# Patient Record
Sex: Male | Born: 1985
Health system: Southern US, Community
[De-identification: ages and names within clinical notes are randomized; demographics above are authoritative.]

## PROBLEM LIST (undated history)

## (undated) DIAGNOSIS — K219 Gastro-esophageal reflux disease without esophagitis: Secondary | ICD-10-CM

## (undated) HISTORY — PX: OTHER SURGICAL HISTORY: SHX169

## (undated) HISTORY — PX: KNEE SURGERY: SHX244

---
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
## 2010-03-12 ENCOUNTER — Inpatient Hospital Stay (HOSPITAL_COMMUNITY)
Admission: EM | Admit: 2010-03-12 | Discharge: 2010-03-14 | Payer: Self-pay | Source: Home / Self Care | Admitting: Emergency Medicine

## 2010-08-30 LAB — CBC
HCT: 40.9 % (ref 39.0–52.0)
HCT: 44 % (ref 39.0–52.0)
Hemoglobin: 14.3 g/dL (ref 13.0–17.0)
Hemoglobin: 14.4 g/dL (ref 13.0–17.0)
Hemoglobin: 15.2 g/dL (ref 13.0–17.0)
MCH: 33 pg (ref 26.0–34.0)
MCH: 33.2 pg (ref 26.0–34.0)
MCHC: 34.6 g/dL (ref 30.0–36.0)
MCHC: 34.8 g/dL (ref 30.0–36.0)
MCV: 95.8 fL (ref 78.0–100.0)
MCV: 96 fL (ref 78.0–100.0)
Platelets: 272 10*3/uL (ref 150–400)
Platelets: 278 10*3/uL (ref 150–400)
RBC: 4.32 MIL/uL (ref 4.22–5.81)
RBC: 4.36 MIL/uL (ref 4.22–5.81)
RBC: 4.6 MIL/uL (ref 4.22–5.81)
RDW: 12.4 % (ref 11.5–15.5)
WBC: 13.2 10*3/uL — ABNORMAL HIGH (ref 4.0–10.5)
WBC: 7.4 10*3/uL (ref 4.0–10.5)
WBC: 9.3 10*3/uL (ref 4.0–10.5)

## 2010-08-30 LAB — BASIC METABOLIC PANEL
BUN: 13 mg/dL (ref 6–23)
CO2: 28 mEq/L (ref 19–32)
CO2: 28 mEq/L (ref 19–32)
Calcium: 9.3 mg/dL (ref 8.4–10.5)
Calcium: 9.4 mg/dL (ref 8.4–10.5)
Calcium: 9.5 mg/dL (ref 8.4–10.5)
Chloride: 100 mEq/L (ref 96–112)
Chloride: 109 mEq/L (ref 96–112)
Creatinine, Ser: 1.12 mg/dL (ref 0.4–1.5)
GFR calc Af Amer: >60 mL/min (ref >60)
GFR calc Af Amer: >60 mL/min (ref >60)
GFR calc Af Amer: >60 mL/min (ref >60)
GFR calc non Af Amer: >60 mL/min (ref >60)
GFR calc non Af Amer: >60 mL/min (ref >60)
Glucose, Bld: 92 mg/dL (ref 70–99)
Glucose, Bld: 93 mg/dL (ref 70–99)
Potassium: 4.1 mEq/L (ref 3.5–5.1)
Potassium: 4.5 mEq/L (ref 3.5–5.1)
Sodium: 138 mEq/L (ref 135–145)
Sodium: 140 mEq/L (ref 135–145)
Sodium: 142 mEq/L (ref 135–145)

## 2010-08-30 LAB — HEMOGLOBIN A1C
Hgb A1c MFr Bld: 5.2 % (ref <5.7)
Mean Plasma Glucose: 103 mg/dL (ref <117)

## 2010-08-30 LAB — CULTURE, ROUTINE-ABSCESS

## 2010-08-30 LAB — DIFFERENTIAL
Basophils Absolute: 0 10*3/uL (ref 0.0–0.1)
Basophils Relative: 0 % (ref 0–1)
Basophils Relative: 1 % (ref 0–1)
Eosinophils Absolute: 0.2 10*3/uL (ref 0.0–0.7)
Eosinophils Absolute: 0.3 10*3/uL (ref 0.0–0.7)
Eosinophils Relative: 1 % (ref 0–5)
Lymphocytes Relative: 13 % (ref 12–46)
Lymphocytes Relative: 24 % (ref 12–46)
Lymphocytes Relative: 26 % (ref 12–46)
Lymphs Abs: 1.6 10*3/uL (ref 0.7–4.0)
Lymphs Abs: 1.8 10*3/uL (ref 0.7–4.0)
Lymphs Abs: 2.5 10*3/uL (ref 0.7–4.0)
Monocytes Absolute: 0.8 10*3/uL (ref 0.1–1.0)
Monocytes Absolute: 1.5 10*3/uL — ABNORMAL HIGH (ref 0.1–1.0)
Monocytes Relative: 11 % (ref 3–12)
Monocytes Relative: 11 % (ref 3–12)
Monocytes Relative: 12 % (ref 3–12)
Neutro Abs: 4.5 10*3/uL (ref 1.7–7.7)
Neutro Abs: 9.8 10*3/uL — ABNORMAL HIGH (ref 1.7–7.7)
Neutrophils Relative %: 58 % (ref 43–77)
Neutrophils Relative %: 61 % (ref 43–77)
Neutrophils Relative %: 75 % (ref 43–77)

## 2010-08-30 LAB — TSH: TSH: 6.721 u[IU]/mL — ABNORMAL HIGH (ref 0.350–4.500)

## 2010-10-12 ENCOUNTER — Emergency Department (HOSPITAL_COMMUNITY)
Admission: EM | Admit: 2010-10-12 | Discharge: 2010-10-12 | Disposition: A | Discharge status: Home / Self Care | Payer: Self-pay | Attending: Emergency Medicine | Admitting: Emergency Medicine

## 2010-10-12 ENCOUNTER — Emergency Department (HOSPITAL_COMMUNITY): Payer: Self-pay

## 2010-10-12 DIAGNOSIS — M2390 Unspecified internal derangement of unspecified knee: Secondary | ICD-10-CM | POA: Insufficient documentation

## 2010-10-12 DIAGNOSIS — M25469 Effusion, unspecified knee: Secondary | ICD-10-CM | POA: Insufficient documentation

## 2010-10-23 ENCOUNTER — Encounter: Payer: Self-pay | Admitting: Orthopedic Surgery

## 2010-10-23 ENCOUNTER — Ambulatory Visit (INDEPENDENT_AMBULATORY_CARE_PROVIDER_SITE_OTHER): Payer: Self-pay | Admitting: Orthopedic Surgery

## 2010-10-23 VITALS — Ht 72.0 in | Wt 193.0 lb

## 2010-10-23 DIAGNOSIS — S83242A Other tear of medial meniscus, current injury, left knee, initial encounter: Secondary | ICD-10-CM | POA: Insufficient documentation

## 2010-10-23 DIAGNOSIS — IMO0002 Reserved for concepts with insufficient information to code with codable children: Secondary | ICD-10-CM

## 2010-10-23 NOTE — Progress Notes (Signed)
LEFT knee pain. And felt pain in the medial side of his LEFT knee. He went to the hospital eventually and had x-rays which show joint effusion. No fracture.  His injury date was April 21  He still having pain and loss of motion, he can extend it or fully bend his LEFT knee.  Pain is now 4/10. Seems to come and go is worse when he moves his knee. The wrong way.  Review of systems positive for heartburn and joint pain otherwise as it will systems were normal.  Social history he is single, unemployed, does not smoke or drink.  No medical problems. No previous surgeries. No medications. Doesn't family history diabetes.   General: The patient is normally developed, with normal grooming and hygiene. There are no gross deformities. The body habitus is normal   CDV: The pulse and perfusion of the extremities are normal   LYMPH: There is no gross lymphadenopathy in the extremities   Skin: There are no rashes, ulcers or cafe-au-lait spot   Psyche: The patient is alert, awake and oriented.  Mood is normal   Neuro:  The coordination and balance are normal.  Sensation is normal. Reflexes are 2+ and equal   Musculoskeletal  LEFT knee medial joint line tenderness; flexion is only 110. Full extension is blocked. No joint effusion is noted. ACL and PCL are stable. Muscle tone is normal.  RIGHT knee. Normal exam   Impression medial meniscal tear.  Recommend MRI if he can get that approved through the hospital discount program. I did explain that the anesthesia free. Would not be covered if he needed surgery.

## 2010-10-29 ENCOUNTER — Telehealth: Payer: Self-pay | Admitting: Orthopedic Surgery

## 2010-10-29 NOTE — Telephone Encounter (Signed)
Patient states received verification of 100% [place] discount per Lubertha Basque and Roberts Gaudy; asking if we can schedule MRI.  Copy of email verification received.  Patient's ph# is [phone]

## 2010-11-01 ENCOUNTER — Other Ambulatory Visit: Payer: Self-pay | Admitting: Radiology

## 2010-11-01 DIAGNOSIS — M25562 Pain in left knee: Secondary | ICD-10-CM

## 2010-11-02 NOTE — Op Note (Signed)
[place]  Patient:    Jordan Ball, Jordan Ball                       MRN: [id number] Proc. Date: 02/22/00 Adm. Date:  [id number] Disc. Date: [id number] Attending:  Ronne Binning                           Operative Report  PREOPERATIVE DIAGNOSIS:  Fractured third metacarpal of the left hand.  POSTOPERATIVE DIAGNOSIS:  Fractured third metacarpal of the left hand.  OPERATION:  Closed reduction pinning third metacarpal of the left hand.  SURGEON:  Nicki Reaper, M.D.  ASSISTANT:  None.  ANESTHESIA:  General.  ANESTHESIOLOGIST:  Edwin Cap. Zoila Shutter, M.D.  HISTORY:  The patient is a 25 year old male who suffered a fracture of the neck of his third metacarpal playing football.  He is brought in for reducing and pinning.  PROCEDURE:  The patient was brought to the operating room where a general anesthesia was carried out without difficulty.  He was prepped and draped using Betadine scrub and solution with the left arm free.  The fracture was manipulated and reduced.  X-rays were taken in the [facility] lateral oblique direction and revealed anatomic reduction.  A 4/5 K-wire was then passed through the articular surface distally and into the shaft longitudinally to protect the epiphyseal plate.  This fully stabilized the fracture fragment. This revealed it in the shaft with good immobilization.  The pin was then cut short.  A sterile compressive dressing and splint was applied.  The patient tolerated the procedure well.  It should be noted that it was placed to the ulnar side of the extensor tendon.  The patient is discharged home to return to the [facility] [place] in one week with Tylenol #3 and Keflex. DD:  02/22/00 TD:  02/24/00 Job: [id number]

## 2010-11-06 ENCOUNTER — Ambulatory Visit (HOSPITAL_COMMUNITY)
Admission: RE | Admit: 2010-11-06 | Discharge: 2010-11-06 | Disposition: A | Discharge status: Home / Self Care | Payer: Self-pay | Source: Ambulatory Visit | Attending: Orthopedic Surgery | Admitting: Orthopedic Surgery

## 2010-11-06 DIAGNOSIS — M25562 Pain in left knee: Secondary | ICD-10-CM

## 2010-11-06 DIAGNOSIS — S72413A Displaced unspecified condyle fracture of lower end of unspecified femur, initial encounter for closed fracture: Secondary | ICD-10-CM | POA: Insufficient documentation

## 2010-11-06 DIAGNOSIS — X58XXXA Exposure to other specified factors, initial encounter: Secondary | ICD-10-CM | POA: Insufficient documentation

## 2010-11-06 DIAGNOSIS — S83509A Sprain of unspecified cruciate ligament of unspecified knee, initial encounter: Secondary | ICD-10-CM | POA: Insufficient documentation

## 2010-11-06 DIAGNOSIS — M25569 Pain in unspecified knee: Secondary | ICD-10-CM | POA: Insufficient documentation

## 2010-11-06 NOTE — Telephone Encounter (Signed)
Patient called to verify MRI appointment. Per call to patient per Renee:  MRI scheduled at [facility] for 11/06/10. Patient to register at 11:30am for MRI. Follow up appointment here is scheduled 11/13/10, 3:00pm

## 2010-11-13 ENCOUNTER — Ambulatory Visit (INDEPENDENT_AMBULATORY_CARE_PROVIDER_SITE_OTHER): Payer: Self-pay | Admitting: Orthopedic Surgery

## 2010-11-13 DIAGNOSIS — S83509A Sprain of unspecified cruciate ligament of unspecified knee, initial encounter: Secondary | ICD-10-CM

## 2010-11-13 DIAGNOSIS — S83519A Sprain of anterior cruciate ligament of unspecified knee, initial encounter: Secondary | ICD-10-CM

## 2010-11-13 NOTE — Progress Notes (Signed)
Preoperative visit.  History and physical   We recommended surgical reconstruction of the patient's LEFT knee secondary to chronic ACL tear in his age.  Discuss risk-benefit ratio surgical versus nonsurgical treatment. And patience. This age really recommend surgery. Although the patient may have some continued symptoms in the knee. He has a much better. He has a Much better chance of a functional knee with surgery.  Dictated history and physical at later time.  Previous notes as follows:  LEFT knee pain.  And felt pain in the medial side of his LEFT knee. He went to the hospital eventually and had x-rays which show joint effusion. No fracture.  His injury date was April 21  He still having pain and loss of motion, he can extend it or fully bend his LEFT knee.  Pain is now 4/10. Seems to come and go is worse when he moves his knee. The wrong way.  Review of systems positive for heartburn and joint pain otherwise as it will systems were normal.  Social history he is single, unemployed, does not smoke or drink.  No medical problems. No previous surgeries. No medications. Doesn't family history diabetes.  General: The patient is normally developed, with normal grooming and hygiene. There are no gross deformities. The body habitus is normal  CDV: The pulse and perfusion of the extremities are normal  LYMPH: There is no gross lymphadenopathy in the extremities  Skin: There are no rashes, ulcers or cafe-au-lait spot  Psyche: The patient is alert, awake and oriented.  Mood is normal  Neuro:  The coordination and balance are normal.  Sensation is normal.  Reflexes are 2+ and equal  Musculoskeletal  LEFT knee medial joint line tenderness; flexion is only 110. Full extension is blocked.  No joint effusion is noted. ACL and PCL are stable. Muscle tone is normal.  RIGHT knee. Normal exam  Impression medial meniscal tear.  Recommend MRI if he can get that approved through the hospital discount  program. I did explain that the anesthesia free. Would not be covered if he needed surgery.

## 2010-11-13 NOTE — Patient Instructions (Signed)
Advise knee reconstruction  Plan for surgery left knee

## 2010-11-26 ENCOUNTER — Other Ambulatory Visit (HOSPITAL_COMMUNITY): Payer: Self-pay

## 2010-12-10 ENCOUNTER — Encounter (HOSPITAL_COMMUNITY)
Admission: RE | Admit: 2010-12-10 | Discharge: 2010-12-10 | Disposition: A | Discharge status: Home / Self Care | Payer: Self-pay | Source: Ambulatory Visit | Attending: Orthopedic Surgery | Admitting: Orthopedic Surgery

## 2010-12-10 LAB — HEMOGLOBIN AND HEMATOCRIT, BLOOD
HCT: 47.3 % (ref 39.0–52.0)
Hemoglobin: 16.6 g/dL (ref 13.0–17.0)

## 2010-12-10 LAB — SURGICAL PCR SCREEN
MRSA, PCR: NEGATIVE
Staphylococcus aureus: POSITIVE — AB

## 2010-12-10 LAB — BASIC METABOLIC PANEL
CO2: 31 mEq/L (ref 19–32)
Chloride: 103 mEq/L (ref 96–112)
Creatinine, Ser: 1.19 mg/dL (ref 0.50–1.35)
GFR calc Af Amer: >60 mL/min (ref >60)
Sodium: 141 mEq/L (ref 135–145)

## 2010-12-13 ENCOUNTER — Encounter: Payer: Self-pay | Admitting: Orthopedic Surgery

## 2010-12-13 NOTE — H&P (Signed)
Diagnoses     Acute medial meniscus tear of left knee   - Primary    836.0      Reason for Visit     Knee Pain    Left knee pain. [facility] on 10-12-10, xrays were taken which was positive for joint effusion, no visible fracture or dislocation. DOI 10-06-10.       Reason For Visit History Recorded        Current Vitals       Recorded User        10/23/2010  3:37 PM  Renee Faith Strader           BP Pulse Temp (Src) Resp Ht Wt    N/A  N/A  N/A (N/A)  N/A  6' (1.829 m)  193 lb (87.544 kg)       BMI SpO2 PF    26.18 kg/m2  N/A  N/A          Progress Notes     Fuller Canada, MD  10/23/2010  3:52 PM  Signed LEFT knee pain. And felt pain in the medial side of his LEFT knee. He went to the hospital eventually and had x-rays which show joint effusion. No fracture.   His injury date was April 21   He still having pain and loss of motion, he can extend it or fully bend his LEFT knee.   Pain is now 4/10. Seems to come and go is worse when he moves his knee. The wrong way.   Review of systems positive for heartburn and joint pain otherwise as it will systems were normal.   Social history he is single, unemployed, does not smoke or drink.   No medical problems. No previous surgeries. No medications. Doesn't family history diabetes.    General: The patient is normally developed, with normal grooming and hygiene. There are no gross deformities. The body habitus is normal    CDV: The pulse and perfusion of the extremities are normal    LYMPH: There is no gross lymphadenopathy in the extremities    Skin: There are no rashes, ulcers or cafe-au-lait spot    Psyche: The patient is alert, awake and oriented.   Mood is normal    Neuro:   The coordination and balance are normal.   Sensation is normal. Reflexes are 2+ and equal    Musculoskeletal  LEFT knee medial joint line tenderness; flexion is only 110. Full extension is blocked. No joint effusion is noted.  ACL and PCL are stable. Muscle tone is normal.   RIGHT knee. Normal exam    MRI shows that he has a medial meniscal tear and an ACL tear.  Plan LEFT ACL reconstruction with patellar tendon autograft and evaluate and treatment of meniscal tear

## 2010-12-14 ENCOUNTER — Ambulatory Visit (HOSPITAL_COMMUNITY)
Admission: RE | Admit: 2010-12-14 | Discharge: 2010-12-14 | Disposition: A | Discharge status: Home / Self Care | Payer: Self-pay | Source: Ambulatory Visit | Attending: Orthopedic Surgery | Admitting: Orthopedic Surgery

## 2010-12-14 DIAGNOSIS — M2350 Chronic instability of knee, unspecified knee: Secondary | ICD-10-CM | POA: Insufficient documentation

## 2010-12-17 ENCOUNTER — Ambulatory Visit (INDEPENDENT_AMBULATORY_CARE_PROVIDER_SITE_OTHER): Payer: Self-pay | Admitting: Orthopedic Surgery

## 2010-12-17 DIAGNOSIS — S83519A Sprain of anterior cruciate ligament of unspecified knee, initial encounter: Secondary | ICD-10-CM | POA: Insufficient documentation

## 2010-12-17 DIAGNOSIS — S83509A Sprain of unspecified cruciate ligament of unspecified knee, initial encounter: Secondary | ICD-10-CM

## 2010-12-17 NOTE — Progress Notes (Signed)
Partial ACL tear evaluated arthroscopically and with exam under anesthesia he was stable  Start weightbearing.  He also had a flexion contracture which was manipulated.  Recommend physical therapy brace at night full weightbearing come back in 3 weeks

## 2010-12-17 NOTE — Patient Instructions (Signed)
Come back in 3 weeks  Wear brace at night  Continue ice and meds as needed

## 2010-12-18 ENCOUNTER — Ambulatory Visit (HOSPITAL_COMMUNITY)
Admission: RE | Admit: 2010-12-18 | Discharge: 2010-12-18 | Disposition: A | Discharge status: Home / Self Care | Payer: Self-pay | Source: Ambulatory Visit | Attending: Orthopedic Surgery | Admitting: Orthopedic Surgery

## 2010-12-18 DIAGNOSIS — M6281 Muscle weakness (generalized): Secondary | ICD-10-CM | POA: Insufficient documentation

## 2010-12-18 DIAGNOSIS — M25669 Stiffness of unspecified knee, not elsewhere classified: Secondary | ICD-10-CM | POA: Insufficient documentation

## 2010-12-18 DIAGNOSIS — IMO0001 Reserved for inherently not codable concepts without codable children: Secondary | ICD-10-CM | POA: Insufficient documentation

## 2010-12-18 DIAGNOSIS — R262 Difficulty in walking, not elsewhere classified: Secondary | ICD-10-CM | POA: Insufficient documentation

## 2010-12-18 DIAGNOSIS — M25569 Pain in unspecified knee: Secondary | ICD-10-CM | POA: Insufficient documentation

## 2010-12-20 ENCOUNTER — Ambulatory Visit (HOSPITAL_COMMUNITY)
Admission: RE | Admit: 2010-12-20 | Discharge: 2010-12-20 | Disposition: A | Discharge status: Home / Self Care | Payer: Self-pay | Source: Ambulatory Visit | Attending: *Deleted | Admitting: *Deleted

## 2010-12-25 ENCOUNTER — Ambulatory Visit (HOSPITAL_COMMUNITY)
Admission: RE | Admit: 2010-12-25 | Discharge: 2010-12-25 | Disposition: A | Discharge status: Home / Self Care | Payer: Self-pay | Source: Ambulatory Visit | Attending: *Deleted | Admitting: *Deleted

## 2010-12-25 NOTE — Progress Notes (Signed)
Physical Therapy Treatment Patient Name: Jordan Ball [id number] Date: 12/25/2010  Visit #: 3/3  Time In: 4:08  Time Out: 4:40  Subjective: "I was a little sore after last time but it was okay" 6/10 pain left knee.  Objective: Pt ambulates into therapy with gait deviations secondary to decreased knee flex/ext.           Exercise/Treatments @FLOW [phone]  Gastroc stretch completed 3x30" (unable to document in doc flowsheets) See doc flowsheets for further details on exercise/treatments.  Goals PT Short Term Goals Short Term Goal 1: Independent in HEP Long Term Goal 1 Progress: Progressing toward goal Short Term Goal 2: Pain level to be decreased by 2. Long Term Goal 2 Progress: Progressing toward goal Short Term Goal 3: ROM to be improved to 12-90 deg. to allow pt to sit comfortably as well as have a more normalized gait. Long Term Goal 3 Progress: Progressing toward goal PT Long Term Goals Long Term Goal 1: Pt's pain level to be decreased by 5. Long Term Goal 1 Progress: Progressing toward goal Long Term Goal 2: ROM to be 3-110 deg. to allow pat to have a normalized gait and be able to squat. Long Term Goal 2 Progress: Progressing toward goal Long Term Goal 3: Strength to be improved to at least a 4+/5 to allow pt to squat down and come back up without difficulty Long Term Goal 3 Progress: Progressing toward goal Long Term Goal 4: On her feet for 3 hrs without any increased pain. Be on his feet for 4-5 hrs without having to sit down. Long Term Goal 4 Progress: Progressing toward goal End of Session Patient Active Problem List  Diagnoses  . Acute medial meniscus tear of left  knee  . ACL injury tear   PT - End of Session Activity Tolerance: Patient tolerated treatment well  Assessment: Pt completes therex with some difficulty secondary to decreased strength and ROM. Pt completes therex with minimal need for cueing.   Plan:  Continue per PT POC.   Seth Bake Leah 12/25/2010, 5:10 PM

## 2010-12-27 ENCOUNTER — Ambulatory Visit (HOSPITAL_COMMUNITY)
Admission: RE | Admit: 2010-12-27 | Discharge: 2010-12-27 | Payer: Self-pay | Source: Ambulatory Visit | Attending: *Deleted | Admitting: *Deleted

## 2010-12-27 NOTE — Progress Notes (Signed)
Physical Therapy Treatment Patient Name: Jordan Ball [id number]'[id number] Date: 12/27/2010  Visit #: 4/4  Time In: 3:30  Time Out: 4:25  Subjective: Pt reports his knee only hurts after just waking up of going from sit to stand.    Exercise/Treatments See doc flowsheets for details on therex.  Manual Therapy Manual Therapy: Other (comment) Other Manual Therapy: PROM to increase left knee ext/flex with patella mobs  Goals PT Short Term Goals Long Term Goal 1 Progress: Progressing toward goal Long Term Goal 2 Progress: Progressing toward goal Long Term Goal 3 Progress: Progressing toward goal PT Long Term Goals Long Term Goal 1 Progress: Progressing toward goal Long Term Goal 2 Progress: Progressing toward goal Long Term Goal 3 Progress: Progressing toward goal Long Term Goal 4 Progress: Progressing toward goal End of Session Patient Active Problem List  Diagnoses  . Acute medial meniscus tear of left knee  . ACL injury tear   PT - End of Session Activity Tolerance: Patient tolerated treatment well General Behavior During Session: [facility] for tasks performed Cognition: [facility] for tasks performed PT Assessment and Plan Clinical Impression Statement: Pt completes therex with minimal difficulty. Pt tolerates increased wt and reps well. Rehab Potential: Good PT Frequency: Min 2X/week PT Duration: 4 weeks PT Treatment/Interventions: Therapeutic exercise (manual therapy)   [name] 12/27/2010, 4:37 PM

## 2010-12-31 NOTE — Op Note (Signed)
NAME:  Jordan Ball, Jordan Ball NO.:  [id number]  MEDICAL RECORD NO.:  [id number]  LOCATION:  [facility]                          FACILITY:  [facility]  PHYSICIAN:  Vickki Hearing, M.D.DATE OF BIRTH:  [birth year]  DATE OF PROCEDURE:  12/14/2010 DATE OF DISCHARGE:                              OPERATIVE REPORT   PREOPERATIVE DIAGNOSIS:  Complete tear, anterior cruciate ligament, left knee.  POSTOPERATIVE DIAGNOSIS:  Partial tear, anterior cruciate ligament, left knee.  PROCEDURE:  Exam under anesthesia, manipulation under anesthesia, diagnostic arthroscopy left knee, and limited debridement.  SURGEON:  Vickki Hearing, MD.  ASSISTED BY:  Pervis Hocking.  ANESTHESIA:  General.  OPERATIVE FINDINGS:  The patient had a flexion contracture of his left knee, which was treated by manipulation with good result.  The patient had a negative Lachman under anesthesia, had a negative pivot under anesthesia, had a negative drawer under anesthesia.  Under arthroscopy, he had partial tear of the ACL with lateral wall fibers still intact. There was good tension on the ACL.  The Lachman and the drawer tests were repeated under direct visualization and there was good stability to the knee.  DETAILS OF PROCEDURE:  The patient was identified as Jordan Ball. His left knee was marked for surgery.  I countersigned his marking.  His chart was updated and he was taken to the operating room where he received 1 gram of Ancef and he had general anesthesia.  Once under anesthesia, exam under anesthesia was performed.  The knee was noted to have a flexion contracture.  This was treated by direct pressure on the front of the knee and the knee came to full extension.  The knee was then flexed and found to have full flexion.  We then did a Lachman test. It was grade zero.  We did a pivot shift.  There was no pivot shift elicited.  He also had a negative drawer at that time and  collateral ligaments were stable.  The knee was prepped and draped with sterile technique and then time-out procedure was executed.  Lateral portal was established and diagnostic arthroscopy was performed.  The patellofemoral joint, medial compartment including the medial meniscus and the lateral compartment including the lateral meniscus were normal.  There was a significant amount of scarring in the front of the knee and this was debrided through a medial portal.  We isolated the ACL fibers found that the lateral wall sign was normal with intact lateral wall fibers.  We debrided the synovial tissue covering the ACL and PCL and then probed the ACL and found it to be intact.  We then did a Lachman and a drawer test under direct visualization and still found the ligament intact.  Again, the fibers had good tension.  I then proceeded to look at the MRI again and the report indeed said that there was complete tear of the ACL, however, there were fibers that were intact.  However, there were classic bone bruises for an ACL tear. Decision was made at that time based on his exam under anesthesia and his exam under direct visualization that the ACL fibers were still intact and that the patient's knee was  stable and that this was a partial tear.  The knee was irrigated and injected with 45 mL of Marcaine with epinephrine and then the portals were closed with 3-0 nylon.  A cryo cuff was applied over dressing and the patient was taken to the recovery room in stable condition.  The postoperative plan is for full weightbearing in a knee immobilizer to help maintain extension and then physical therapy emphasizing hamstrings and quadriceps for rehab.  DISCHARGE MEDICATIONS: 1. Phenergan 25 mg q.4 hours as needed for nausea, #30. 2. Hydrocodone 7.5/325 one q.4 p.r.n. for pain, #60, refills are 3. 3. Ibuprofen 800 mg q.8, #90, refills are 2.     Vickki Hearing, M.D.     SEH/MEDQ  D:   12/14/2010  T:  12/14/2010  Job:  [id number]  Electronically Signed by Fuller Canada M.D. on 12/31/2010 05:53:54 PM

## 2011-01-01 ENCOUNTER — Ambulatory Visit (HOSPITAL_COMMUNITY)
Admission: RE | Admit: 2011-01-01 | Discharge: 2011-01-01 | Disposition: A | Discharge status: Home / Self Care | Payer: Self-pay | Source: Ambulatory Visit | Attending: Orthopedic Surgery | Admitting: Orthopedic Surgery

## 2011-01-01 NOTE — Progress Notes (Signed)
Physical Therapy Treatment Patient Name: Jordan Ball Date: 01/01/2011  Visit # : 5/5 Initial Evaluation Date: 12/18/10   HPI: Symptoms/Limitations Symptoms: Sore today Pain Assessment Currently in Pain?: Yes Pain Score:   5 Pain Location: Knee Pain Orientation: Left   Exercise/Treatments Lumbar Stretches ITB Stretch: 3 reps;30 seconds Lumbar Machine Exercises Cybex Press: 2.5Pl 2x10 L only Stationary Bike: 6'@2 .0 Hip Exercises Heel Slides: Left;10 reps;AROM Straight Leg Raises: Strengthening;Left;15 reps;Supine Hamstring Curl: 15 reps (w/5# wt in prone) Hip Extension: 15 reps;Left;Strengthening (W/5# wt in prone) Hip ABduction/ADduction: 15 reps;Left;Strengthening (Abd w/5# wt; Add w/3# wt) Additional Hip Exercises Rebounder: 10x w/grn ball Lateral Step Up: 10 reps;Step Height: 4" Forward Step Up: 10 reps;Step Height: 4" Rocker Board: 2 minutes Stationary Bike: 6'@2 .0 Knee Stretches Knee: Self-Stretch to increase Flexion: 3 reps;30 seconds ITB Stretch: 3 reps;30 seconds Knee Exercises Knee Extension: Strengthening;Left;Other reps (comment);Standing (20 reps TKE w/blue band ) Straight Leg Raises: Strengthening;Left;15 reps;Supine Heel Slides: Left;10 reps;AROM Hip Extension: 15 reps;Left;Strengthening (W/5# wt in prone) Hamstring Curl: 15 reps (w/5# wt in prone) Hip ABduction/ADduction: 15 reps;Left;Strengthening (Abd w/5# wt; Add w/3# wt) Stool Scoot - Round Trips: 2RT carpet LLE only Additional Knee Exercises Lateral Step Up: 10 reps;Step Height: 4" Forward Step Up: 10 reps;Step Height: 4" Functional Squat: 10 reps (LLE only) Rocker Board: 2 minutes SLS with Vectors: 10x Rebounder: 10x w/grn ball Additional Ankle Exercises Rebounder: 10x w/grn ball Rocker Board: 2 minutes Balance Exercises Stationary Bike: 6'@2 .0 Manual Therapy Manual Therapy: Other (comment) Other Manual Therapy: PROM to increase flexion ext; patella mobs (Each exercise done  once, repeated exercise due to computer error)    Goals PT Short Term Goals Short Term Goal 1 Progress: Progressing toward goal Short Term Goal 2 Progress: Progressing toward goal Short Term Goal 3 Progress: Progressing toward goal Short Term Goal 4 Progress: Progressing toward goal PT Long Term Goals Long Term Goal 1 Progress: Progressing toward goal Long Term Goal 2 Progress: Progressing toward goal Long Term Goal 3 Progress: Progressing toward goal Long Term Goal 4 Progress: Progressing toward goal End of Session Patient Active Problem List  Diagnoses  . Acute medial meniscus tear of left knee  . ACL injury tear   PT - End of Session Activity Tolerance: Patient tolerated treatment well General Behavior During Session: [facility] for tasks performed Cognition: [facility] for tasks performed PT Assessment and Plan Clinical Impression Statement: Pt completes therex with minimal difficulty. Pt displays increased stability and ROM with therex.  PT Treatment/Interventions: Therapeutic exercise PT Plan: Continue to progress strength/ROM   Jordan Ball 01/01/2011, 2:52 PM

## 2011-01-03 ENCOUNTER — Ambulatory Visit (HOSPITAL_COMMUNITY)
Admission: RE | Admit: 2011-01-03 | Discharge: 2011-01-03 | Disposition: A | Discharge status: Home / Self Care | Payer: Self-pay | Source: Ambulatory Visit | Attending: Orthopedic Surgery | Admitting: Orthopedic Surgery

## 2011-01-03 NOTE — Progress Notes (Signed)
Physical Therapy Treatment Patient Name: Jordan Ball [id number] Date: 01/03/2011  Visit # :6/6 Initial Evaluation Date: 12/18/11 Charges: Manual x8'; ROMx1; MMTx1   HPI: Symptoms/Limitations Symptoms: A little sore; Ialways feel better when I leave therapy. Pain Assessment Currently in Pain?: Yes Pain Score:   3 Pain Location: Knee Pain Orientation: Left  Objective:ROM/Strength: Left knee AROM 17-103; PROM 10-110 Strength: L Hamstring 4+/5; all other LLE mm 5/5   Exercise/Treatments Lumbar Machine Exercises Stationary Bike: 6'@3 .0 Additional Hip Exercises Stationary Bike: 6'@3 .0 Balance Exercises Stationary Bike: 6'@3 .0 Manual Therapy Manual Therapy: Other (comment) Other Manual Therapy: PROM to increase ext; Contract relax to increase flex (Bike completed once, repeated exercise due to computer error)  Goals PT Short Term Goals Short Term Goal 1 Progress: Met Short Term Goal 2 Progress: Progressing toward goal Short Term Goal 3 Progress: Progressing toward goal Short Term Goal 4 Progress: Progressing toward goal PT Long Term Goals Long Term Goal 1 Progress: Progressing toward goal Long Term Goal 2 Progress: Progressing toward goal Long Term Goal 3 Progress: Progressing toward goal Long Term Goal 4 Progress: Progressing toward goal End of Session Patient Active Problem List  Diagnoses  . Acute medial meniscus tear of left knee  . ACL injury tear   PT - End of Session Activity Tolerance: Patient tolerated treatment well General Behavior During Session: [facility] for tasks performed Cognition: [facility] for tasks performed PT Assessment and Plan Clinical Impression Statement: Pt presents with increased ROM; Progress note completed prior to MD appt, see letters. PT Treatment/Interventions: Other (comment) (Manual therapy x8') PT Plan: Continue per PT POC. x2 more weeks (remainder of PT POC)   [name] 01/03/2011, 2:41 PM

## 2011-01-08 ENCOUNTER — Telehealth (HOSPITAL_COMMUNITY): Payer: Self-pay | Admitting: Physical Therapy

## 2011-01-08 ENCOUNTER — Inpatient Hospital Stay (HOSPITAL_COMMUNITY): Admission: RE | Admit: 2011-01-08 | Payer: Self-pay | Source: Ambulatory Visit | Admitting: Physical Therapy

## 2011-01-08 ENCOUNTER — Ambulatory Visit (INDEPENDENT_AMBULATORY_CARE_PROVIDER_SITE_OTHER): Payer: Self-pay | Admitting: Orthopedic Surgery

## 2011-01-08 DIAGNOSIS — S83509A Sprain of unspecified cruciate ligament of unspecified knee, initial encounter: Secondary | ICD-10-CM

## 2011-01-08 DIAGNOSIS — S83519A Sprain of anterior cruciate ligament of unspecified knee, initial encounter: Secondary | ICD-10-CM

## 2011-01-08 DIAGNOSIS — M24569 Contracture, unspecified knee: Secondary | ICD-10-CM | POA: Insufficient documentation

## 2011-01-08 NOTE — Patient Instructions (Signed)
3 x a day prone hang x 30 minutes   Continue therapy

## 2011-01-08 NOTE — Progress Notes (Signed)
Postoperative visit.  Status post arthroscopy, LEFT knee.  Operative findings included intact. Partial ACL tear with stable knee under anesthesia.  Patient has severe flexion contracture.  Flexion contracture seems to persist.  Recommending crease, prone hangs to 3 times a day for 30 minutes.  Today's flexion 110.  Continue the therapy, Followup in 4 weeks

## 2011-01-10 ENCOUNTER — Ambulatory Visit (HOSPITAL_COMMUNITY)
Admission: RE | Admit: 2011-01-10 | Discharge: 2011-01-10 | Disposition: A | Discharge status: Home / Self Care | Payer: Self-pay | Source: Ambulatory Visit | Attending: Orthopedic Surgery | Admitting: Orthopedic Surgery

## 2011-01-10 NOTE — Progress Notes (Signed)
Physical Therapy Treatment Patient Name: Jordan Ball Date: 01/10/2011  Time in: 2:45 Time out: 3:54 Visit # :7/7  Initial Evaluation Date: 12/18/11  Charges: Manual x10' Therex 45'  HPI: Symptoms/Limitations Symptoms: Feels good today. I've been doing knee hangs 3 times a day for 30 minutes like Dr.Harrison told me to. Pain Assessment Currently in Pain?: No/denies   Exercise/Treatments  Warm up: Stationary Bike: 8'@3 .0  Standing: Rebounder: 15xw/o foam; 15 x w/foam (red ball) Lateral Step Up: 10 reps;Step Height: 6" Forward Step Up: 10 reps;Step Height: 6" Rocker Board: 2 minutes Functional Squat: 15 reps (LLE only) SLS with Vectors: 10x  Seated: Cybex Knee Extension: 2pl 2x10 left only Cybex Knee Flexion: 3pl 2x10 Cybex Press: 3Pl 2x10 L only Stool Scoot - Round Trips: 2RT carpet LLE only  Supine: ITB Stretch: 3 reps;30 seconds Heel Slides: 10 reps;Left;Supine Straight Leg Raises: Strengthening;Left;15 reps;Supine (4#) Knee: Self-Stretch to increase Flexion: 5 reps;30 seconds (against wall)  Side lying: Hip ABduction: 15 reps;Left;Strengthening;Sidelying Hip ADduction: 15 reps;Left;Sidelying   Manual Therapy Manual Therapy: Other (comment) Other Manual Therapy: PROM to increase ext/flex; MFR/STM to L hamstring with prone knee hang  Goals PT Short Term Goals Short Term Goal 1 Progress: Met Short Term Goal 2 Progress: Progressing toward goal Short Term Goal 3 Progress: Progressing toward goal PT Long Term Goals Long Term Goal 1 Progress: Partly met Long Term Goal 2 Progress: Progressing toward goal Long Term Goal 3 Progress: Progressing toward goal Long Term Goal 4 Progress: Progressing toward goal End of Session Patient Active Problem List  Diagnoses  . Acute medial meniscus tear of left knee  . ACL injury tear  . Joint contracture of the lower leg   PT - End of Session Activity Tolerance: Patient tolerated treatment  well General Behavior During Session: [facility] for tasks performed Cognition: [facility] for tasks performed PT Assessment and Plan Clinical Impression Statement: Pt present with increased tolerance for PROM. Pt requires VCs to utilize knee ext with gait. PT Treatment/Interventions: Therapeutic exercise;Other (comment) (manual therapy x10') PT Plan: Continue to progress strength/ROM.   Seth Bake [facility] 01/10/2011, 4:00 PM

## 2011-01-15 ENCOUNTER — Inpatient Hospital Stay (HOSPITAL_COMMUNITY): Admission: RE | Admit: 2011-01-15 | Payer: Self-pay | Source: Ambulatory Visit | Admitting: Physical Therapy

## 2011-01-17 ENCOUNTER — Ambulatory Visit (HOSPITAL_COMMUNITY)
Admission: RE | Admit: 2011-01-17 | Discharge: 2011-01-17 | Disposition: A | Discharge status: Home / Self Care | Payer: Self-pay | Source: Ambulatory Visit | Attending: Orthopedic Surgery | Admitting: Orthopedic Surgery

## 2011-01-17 DIAGNOSIS — M25569 Pain in unspecified knee: Secondary | ICD-10-CM | POA: Insufficient documentation

## 2011-01-17 DIAGNOSIS — R262 Difficulty in walking, not elsewhere classified: Secondary | ICD-10-CM | POA: Insufficient documentation

## 2011-01-17 DIAGNOSIS — M25669 Stiffness of unspecified knee, not elsewhere classified: Secondary | ICD-10-CM | POA: Insufficient documentation

## 2011-01-17 DIAGNOSIS — IMO0001 Reserved for inherently not codable concepts without codable children: Secondary | ICD-10-CM | POA: Insufficient documentation

## 2011-01-17 DIAGNOSIS — M6281 Muscle weakness (generalized): Secondary | ICD-10-CM | POA: Insufficient documentation

## 2011-01-17 NOTE — Progress Notes (Signed)
Physical Therapy Evaluation  Patient Name: Jordan Ball'G Date: 01/17/2011 HPI: Partial ACL with knee flexion contracture. Symptoms/Limitations Symptoms: Pt states that he feels more stiffness than anything else. States this is only in the morning when he wakes up after 10-15 minutes he is pain free. How long can you sit comfortably?: Able to sit for 2 hours at a time without discomfort. How long can you stand comfortably?: no problem How long can you walk comfortably?: two hours. Pain Assessment Currently in Pain?: Yes Pain Score:   4 Pain Location: Knee Pain Orientation: Left Pain Type: Chronic pain Pain Onset: More than a month ago Pain Frequency: Other (Comment) (only in the morning) Pain Relieving Factors: moving Multiple Pain Sites: No Past Medical History: No past medical history on file. Past Surgical History:  Past Surgical History  Procedure Date  . Leftmiddle finger       Prior Functioning I in all functions.        Assessment LLE AROM (degrees) Left Knee Extension 0-130: 12  (was 22) Left Knee Flexion 0-140: 110  (was 75) LLE Strength Left Hip Extension: 5/5 (was 4+) Left Hip ADduction: 5/5 (was 4+) Left Knee Flexion: 5/5 (was 4/5) Left Knee Extension: 5/5 (was 4/5)  Mobility (including Balance) Easier to ambulate.       Exercise/Treatments For stretching only as strength is wnl now.    Goals PT Short Term Goals Short Term Goal 1 Progress: Met Short Term Goal 2 Progress: Met Short Term Goal 3 Progress: Met PT Long Term Goals Long Term Goal 1 Progress: Partly met Long Term Goal 2 Progress: Progressing toward goal Long Term Goal 3 Progress: Met Long Term Goal 4 Progress: Progressing toward goal (able to be up for 2 hours) End of Session Patient Active Problem List  Diagnoses  . Acute medial meniscus tear of left knee  . ACL injury tear  . Joint contracture of the lower leg   PT - End of Session Activity Tolerance: Patient tolerated  treatment well General Behavior During Session: [facility] for tasks performed Cognition: [facility] for tasks performed PT Assessment and Plan Clinical Impression Statement: Pt reassessed today progressing well towards goals.  Strength is wnl stop all strengthening exercises concentrate on ROM. Rehab Potential: Good PT Frequency: Min 2X/week PT Duration: 4 weeks PT Treatment/Interventions: Therapeutic exercise PT Plan: Contine to see 2x week to work on ROM.  Time Calculation Start Time: 0847 Stop Time: 0934 Time Calculation (min): 47 min  RUSSELL,CINDY 01/17/2011, 9:35 AM

## 2011-01-17 NOTE — Patient Instructions (Signed)
Begin standing chair lunge/Gastroc stretch and prone hangs with weight,(was doing prone hangs without weight) along with other stretches.

## 2011-01-22 ENCOUNTER — Inpatient Hospital Stay (HOSPITAL_COMMUNITY): Admission: RE | Admit: 2011-01-22 | Payer: Self-pay | Source: Ambulatory Visit | Admitting: Physical Therapy

## 2011-01-24 ENCOUNTER — Ambulatory Visit (HOSPITAL_COMMUNITY)
Admission: RE | Admit: 2011-01-24 | Discharge: 2011-01-24 | Disposition: A | Discharge status: Home / Self Care | Payer: Self-pay | Source: Ambulatory Visit

## 2011-01-24 NOTE — Progress Notes (Signed)
Physical Therapy Treatment Patient Name: JORMA TASSINARI ZOXWR'U Date: 01/24/2011  Time In: 8:45 Time Out: 9:40 Visit #: 1 out of 8, 9/9 total Next Re-eval: 02/17/2011 Charge: therex x 39 min Manual 10 min   Subjective: Symptoms/Limitations Symptoms: No pain just stiffness today. Pain Assessment Currently in Pain?: No/denies Pain Orientation: Left Pain Type: Chronic pain Pain Onset: More than a month ago   Objective:    Exercise/Treatments   Warm up:  Stationary Bike: 6'@4 .0 Standing:  Rebounder: 15xw/o foam (yellow ball) Lateral Step Up: 15 reps;Step Height: 6"  Forward Step Up: 15 reps;Step Height: 6"  Step down 15 reps Height 6" Rocker Board: 2 minutes  Functional Squat: 15 reps (LLE only)  SLS with Vectors: 5x 10" Stairs 2RT reciprocally without HR Seated:  Cybex Knee Extension: 2.5pl 2x10 left only  Cybex Knee Flexion: 3.5 pl 2x10 L only Cybex Press: 3Pl 2x10 L only  Prone:  Quad st 3x 30"  Knee hang x 5' with 3# with manual MFR/STM to L hs. Supine:  HS St 3x 30" ITB Stretch: 3 reps;30 seconds  Heel Slides: 20 reps;Left;Supine  Manual Therapy  Manual Therapy: Other (comment)  Other Manual Therapy: PROM to increase flex/ext; Manual STM.MFR to H/S during knee hang x 5' with 3#  Goals   End of Session Patient Active Problem List  Diagnoses  . Acute medial meniscus tear of left knee  . ACL injury tear  . Joint contracture of the lower leg   PT - End of Session Activity Tolerance: Patient tolerated treatment well General Behavior During Session: [facility] for tasks performed Cognition: [facility] for tasks performed PT Assessment and Plan Clinical Impression Statement: Pt tolerated treatment well.  ROM improving following PROM, knee hang, and STM/MFR to tight hamstrings. Amb stairs reciprocally without handrails with good eccentric quad control presented. PT Plan: Continue to progress ROM, D/C strengthening ex.  Juel Burrow 01/24/2011, 11:13 AM

## 2011-01-29 ENCOUNTER — Inpatient Hospital Stay (HOSPITAL_COMMUNITY): Admission: RE | Admit: 2011-01-29 | Payer: Self-pay | Source: Ambulatory Visit | Admitting: *Deleted

## 2011-01-29 ENCOUNTER — Telehealth (HOSPITAL_COMMUNITY): Payer: Self-pay | Admitting: *Deleted

## 2011-01-31 ENCOUNTER — Telehealth (HOSPITAL_COMMUNITY): Payer: Self-pay | Admitting: *Deleted

## 2011-01-31 ENCOUNTER — Inpatient Hospital Stay (HOSPITAL_COMMUNITY): Admission: RE | Admit: 2011-01-31 | Payer: Self-pay | Source: Ambulatory Visit | Admitting: *Deleted

## 2011-02-05 ENCOUNTER — Ambulatory Visit (HOSPITAL_COMMUNITY)
Admission: RE | Admit: 2011-02-05 | Discharge: 2011-02-05 | Disposition: A | Discharge status: Home / Self Care | Payer: Self-pay | Source: Ambulatory Visit | Attending: *Deleted | Admitting: *Deleted

## 2011-02-05 ENCOUNTER — Ambulatory Visit (INDEPENDENT_AMBULATORY_CARE_PROVIDER_SITE_OTHER): Payer: Self-pay | Admitting: Orthopedic Surgery

## 2011-02-05 DIAGNOSIS — S83519A Sprain of anterior cruciate ligament of unspecified knee, initial encounter: Secondary | ICD-10-CM

## 2011-02-05 DIAGNOSIS — S83509A Sprain of unspecified cruciate ligament of unspecified knee, initial encounter: Secondary | ICD-10-CM

## 2011-02-05 DIAGNOSIS — M24569 Contracture, unspecified knee: Secondary | ICD-10-CM

## 2011-02-05 NOTE — Progress Notes (Signed)
Physical Therapy Treatment Patient Details  Name: Jordan Ball MRN: [id number] Date of Birth: [birth year]  Today's Date: 02/05/2011 Time: 1191-4782 Time Calculation (min): 45 min Visit#: 2 of 8 Re-eval: 02/14/11 Charge: therex Manual 9 min  Subjective: Symptoms/Limitations Symptoms: No pain Pain Assessment Currently in Pain?: No/denies  Objective: AROM 10-116 degrees PROM 5-127 degrees  Exercise/Treatments Bike 6' @ 5.0 STANDING: TKE 20x 5" SUPINE: Active H/S St 3x 30" ITB St 3x 30" TKE with towel under knee 10 x 5" Heel slide 15 x 5" each direction PROM 3x 30" PRONE: TKE 10x 5" Knee hang with 4# x 5' with STM to semitendinosus Manual Therapy Manual Therapy: Other (comment) (PROM. MFR/STM to hamstrings for increased extension) Other Manual Therapy: PROM to increase ext/flex; MFR/STM to L hamstring with prone knee hang with 4#x 5'  Physical Therapy Assessment and Plan PT Assessment and Plan Clinical Impression Statement: ROM improving overall.  Added supine/prone TKE with vc for tech, completed without diff.  Resolved semitendinosus spasm 50% with increased extension following. PT Plan: Continue progressing ROM.    Goals    Problem List Patient Active Problem List  Diagnoses  . Acute medial meniscus tear of left knee  . ACL injury tear  . Joint contracture of the lower leg    PT - End of Session Activity Tolerance: Patient tolerated treatment well General Behavior During Session: [facility] for tasks performed Cognition: [facility] for tasks performed  Juel Burrow 02/05/2011, 5:58 PM

## 2011-02-05 NOTE — Progress Notes (Signed)
Postoperative visit.  Status post arthroscopy, LEFT knee.  Operative findings included intact. Partial ACL tear with stable knee under anesthesia.  Patient Had severe flexion contracture which is all but resolved.  He has been able to return to work.  His knee is almost straight just a slight bit of flexion contracture.  His ACL feels solid.  Recommend resume normal activities work as tolerated follow up as needed

## 2011-02-07 ENCOUNTER — Inpatient Hospital Stay (HOSPITAL_COMMUNITY): Admission: RE | Admit: 2011-02-07 | Payer: Self-pay | Source: Ambulatory Visit | Admitting: Physical Therapy

## 2011-02-14 ENCOUNTER — Ambulatory Visit (HOSPITAL_COMMUNITY)
Admission: RE | Admit: 2011-02-14 | Discharge: 2011-02-14 | Disposition: A | Discharge status: Home / Self Care | Payer: Self-pay | Source: Ambulatory Visit | Attending: Physical Therapy | Admitting: Physical Therapy

## 2011-02-14 NOTE — Progress Notes (Signed)
Physical Therapy Treatment Patient Details  Name: Jordan Ball MRN: [id number] Date of Birth: 1985/07/28  Today's Date: 02/14/2011 Time: 4098-1191 Time Calculation (min): 38 min Charges: TE 30', Gt. 8' Visit#: 3 of 8 Re-eval: 02/15/11  Subjective: Symptoms/Limitations Symptoms: Pt reports he is doing pretty well and is not having any pain with work activities.  I am doing a lot of my exercises at home.  Exercise/Treatments Manual Therapy Manual Therapy: Joint mobilization Joint Mobilization: Grade II-IV knee joint mobs to increase knee extension x8 minutesBike 6' @ 5.0  STANDING:   TKE  10x 10"   Rebounder yellow ball x30 w/L SLS SUPINE:   Active H/S St 3x 30"   ITB St 3x 30"   PROM 3x 30"  PRONE:   Knee hang with 4# x 5' with STM to semitendinosus  Hamstring Curls 3x10 4#  Physical Therapy Assessment and Plan PT Assessment and Plan Clinical Impression Statement: Pt was able to complete HEP without cueing for proper posture.  Continues to improve AROM.   PT Plan: Re-eval next visit.    Goals    Problem List Patient Active Problem List  Diagnoses  . Acute medial meniscus tear of left knee  . ACL injury tear  . Joint contracture of the lower leg    PT - End of Session Activity Tolerance: Patient tolerated treatment well  LISA MASSIE 02/14/2011, 4:41 PM

## 2011-02-15 ENCOUNTER — Inpatient Hospital Stay (HOSPITAL_COMMUNITY): Admission: RE | Admit: 2011-02-15 | Payer: Self-pay | Source: Ambulatory Visit | Admitting: Physical Therapy

## 2016-06-28 DIAGNOSIS — H5213 Myopia, bilateral: Secondary | ICD-10-CM | POA: Diagnosis not present

## 2016-06-28 DIAGNOSIS — H52223 Regular astigmatism, bilateral: Secondary | ICD-10-CM | POA: Diagnosis not present

## 2017-03-25 ENCOUNTER — Encounter (HOSPITAL_COMMUNITY): Payer: Self-pay | Admitting: *Deleted

## 2017-03-25 ENCOUNTER — Ambulatory Visit (HOSPITAL_COMMUNITY)
Admission: EM | Admit: 2017-03-25 | Discharge: 2017-03-25 | Disposition: A | Discharge status: Home / Self Care | Payer: BLUE CROSS/BLUE SHIELD | Attending: Family Medicine | Admitting: Family Medicine

## 2017-03-25 DIAGNOSIS — K529 Noninfective gastroenteritis and colitis, unspecified: Secondary | ICD-10-CM

## 2017-03-25 NOTE — ED Triage Notes (Signed)
Patient reports vomiting sat,sun,and Monday. Thinks he ate something bad on Saturday night. States he went to work today and was told he needed note work note for tomorrow to return. Patient states that he still does not feel 100%, a little weak and groggy but no longer has and vomiting.

## 2017-03-26 NOTE — ED Provider Notes (Signed)
  [facility]   [id number] 03/25/17 Arrival Time: 1649  ASSESSMENT & PLAN:  1. Gastroenteritis    Resolved. Work note given as he requests. May f/u as needed.  Reviewed expectations re: course of current medical issues. Questions answered. Outlined signs and symptoms indicating need for more acute intervention. Patient verbalized understanding. After Visit Summary given.   SUBJECTIVE:  Jordan Ball is a 31 y.o. male who presents with complaint of 2-3 days of n/v/d without blood. No fever. Very fatigued. No abdominal pain. Symptoms have resolved over the past 24 hours. He needs note to return to work. Feeling much better now.  ROS: As per HPI.   OBJECTIVE:  Vitals:   03/25/17 1706  BP: (!) 142/84  Pulse: (!) 101  Resp: 16  Temp: 98.5 F (36.9 C)  TempSrc: Oral  SpO2: 100%    General appearance: alert; no distress Abdomen: soft, non-tender; bowel sounds normal; no masses or organomegaly; no guarding or rebound tenderness Back: no CVA tenderness Skin: warm and dry Psychological: alert and cooperative; normal mood and affect  No Known Allergies   Social History   Social History  . Marital status: Single    Spouse name: N/A  . Number of children: N/A  . Years of education: 63   Occupational History  . Not on file.   Social History Main Topics  . Smoking status: Never Smoker  . Smokeless tobacco: Never Used  . Alcohol use No  . Drug use: No  . Sexual activity: Not on file   Other Topics Concern  . Not on file   Social History Narrative  . No narrative on file   Family History  Problem Relation Age of Onset  . Diabetes Unknown    Past Surgical History:  Procedure Laterality Date  . leftmiddle finger       Mardella Layman, MD 03/26/17 [phone]

## 2017-04-17 ENCOUNTER — Encounter (HOSPITAL_COMMUNITY): Payer: Self-pay | Admitting: Emergency Medicine

## 2017-04-17 ENCOUNTER — Ambulatory Visit (HOSPITAL_COMMUNITY)
Admission: EM | Admit: 2017-04-17 | Discharge: 2017-04-17 | Disposition: A | Discharge status: Home / Self Care | Payer: BLUE CROSS/BLUE SHIELD | Attending: Emergency Medicine | Admitting: Emergency Medicine

## 2017-04-17 DIAGNOSIS — F329 Major depressive disorder, single episode, unspecified: Secondary | ICD-10-CM | POA: Diagnosis not present

## 2017-04-17 DIAGNOSIS — R4589 Other symptoms and signs involving emotional state: Secondary | ICD-10-CM

## 2017-04-17 NOTE — Discharge Instructions (Signed)
Please keep and attend your appointment scheduled on Monday to establish with a primary care provider. Please choose a counselor/psychologist/therapist to see if this is something you are willing to do. Please note attached suicide hotline if needed. If you develop thoughts of self harm or to hurt another please go to the ER.

## 2017-04-17 NOTE — ED Triage Notes (Signed)
Pt c/o feeling depressed. States his grandmother died a few weeks ago and recnetly found out his Girlfriend cheated on him. Pt denies SI/HI. Denies alcohol or drug use.

## 2017-04-17 NOTE — ED Provider Notes (Signed)
[facility]    CSN: [id number] Arrival date & time: 04/17/17  1551     History   Chief Complaint Chief Complaint  Patient presents with  . Depression    HPI Jordan Ball is a 31 y.o. male.   Jordan Ball presents with complaints of depression for the past few weeks. He states his grandmother who was like a second mother to him passed away a few weeks ago. The day after her death he discovered his girl friend was cheating on him. He states he doesn't want to get up in the morning because he feels depressed. He has missed some work due to this. He states he does have his mother still who is able to talk to about his feelings and who has provided support for him. He denies any previous episodes of depression, he has never been on an antidepressant. Denies suicidal ideation or homicidal ideation. Does not drink alcohol or use illicit drugs. He states he works a lot so does not have a lot of hobbies to begin with, but has not been participating in any. He is otherwise healthy, does not take any medications.       History reviewed. No pertinent past medical history.  Patient Active Problem List   Diagnosis Date Noted  . Joint contracture of the lower leg 01/08/2011  . ACL injury tear 12/17/2010  . Acute medial meniscus tear of left knee 10/23/2010    Past Surgical History:  Procedure Laterality Date  . leftmiddle finger         Home Medications    Prior to Admission medications   Medication Sig Start Date End Date Taking? Authorizing Provider  HYDROcodone-acetaminophen (NORCO) 7.5-325 MG per tablet Take 1 tablet by mouth every 4 (four) hours as needed.      [provider]  IBUPROFEN PO Take by mouth.      [provider]  oxyCODONE-acetaminophen (PERCOCET) 5-325 MG per tablet Take 1 tablet by mouth every 6 (six) hours as needed.      [provider]    Family History Family History  Problem Relation Age of Onset  . Diabetes Unknown      Social History Social History  Substance Use Topics  . Smoking status: Never Smoker  . Smokeless tobacco: Never Used  . Alcohol use No     Allergies   Patient has no known allergies.   Review of Systems Review of Systems   Physical Exam Triage Vital Signs ED Triage Vitals [04/17/17 1558]  Enc Vitals Group     BP 140/75     Pulse Rate 62     Resp 18     Temp 98.4 F (36.9 C)     Temp Source Oral     SpO2 100 %     Weight      Height      Head Circumference      Peak Flow      Pain Score      Pain Loc      Pain Edu?      Excl. in GC?    No data found.   Updated Vital Signs BP 140/75   Pulse 62   Temp 98.4 F (36.9 C) (Oral)   Resp 18   SpO2 100%   Visual Acuity Right Eye Distance:   Left Eye Distance:   Bilateral Distance:    Right Eye Near:   Left Eye Near:    Bilateral Near:  Physical Exam  Constitutional: He appears well-developed and well-nourished. No distress.  Psychiatric: He has a normal mood and affect. His speech is normal and behavior is normal. Judgment and thought content normal. His mood appears not anxious. His affect is not angry, not blunt, not labile and not inappropriate. He is not actively hallucinating. Cognition and memory are normal.  Appears sad with discussing his story, but appropriate to the situation He is attentive.  Vitals reviewed.    UC Treatments / Results  Labs (all labs ordered are listed, but only abnormal results are displayed) Labs Reviewed - No data to display  EKG  EKG Interpretation None       Radiology No results found.  Procedures Procedures (including critical care time)  Medications Ordered in UC Medications - No data to display   Initial Impression / Assessment and Plan / UC Course  I have reviewed the triage vital signs and the nursing notes.  Pertinent labs & imaging results that were available during my care of the patient were reviewed by me and considered in my medical  decision making (see chart for details).       Discussed depression and depression treatment at length with patient. He has obvious acute situations which warrant feelings of sadness. Discussed recommendation of pharmaceutical and therapy for best treatment of depression. He states he has an appointment on 11/5 establishing with a PCP. He states he is going to go to work tomorrow, but does need a note as he missed work. Without SI/HI at this time, contracted safety and follow up on Monday. Recommended finding a therapist he is comfortable with. Patient agreeable, pleasant and motivated.   15 minutes spent with patient and greater than 50% of time spent counseling about depression.   Final Clinical Impressions(s) / UC Diagnoses   Final diagnoses:  Depressed mood    New Prescriptions Discharge Medication List as of 04/17/2017  5:10 PM       Controlled Substance Prescriptions [place] Controlled Substance Registry consulted? Not Applicable   Georgetta HaberBurky, Kayvion Arneson B, NP 04/17/17 1723

## 2018-03-15 ENCOUNTER — Emergency Department (HOSPITAL_COMMUNITY): Payer: Self-pay

## 2018-03-15 ENCOUNTER — Encounter (HOSPITAL_COMMUNITY): Payer: Self-pay | Admitting: Emergency Medicine

## 2018-03-15 ENCOUNTER — Other Ambulatory Visit: Payer: Self-pay

## 2018-03-15 ENCOUNTER — Emergency Department (HOSPITAL_COMMUNITY)
Admission: EM | Admit: 2018-03-15 | Discharge: 2018-03-15 | Disposition: A | Discharge status: Home / Self Care | Payer: Self-pay | Attending: Emergency Medicine | Admitting: Emergency Medicine

## 2018-03-15 DIAGNOSIS — M25562 Pain in left knee: Secondary | ICD-10-CM | POA: Insufficient documentation

## 2018-03-15 MED ORDER — CYCLOBENZAPRINE HCL 10 MG PO TABS: 10.0000 mg | ORAL_TABLET | Freq: Two times a day (BID) | ORAL | 0 refills | Status: DC | PRN

## 2018-03-15 NOTE — ED Provider Notes (Signed)
[facility] EMERGENCY DEPARTMENT Provider Note   CSN: [id number] Arrival date & time: 03/15/18  1141     History   Chief Complaint Chief Complaint  Patient presents with  . Knee Pain    HPI Jordan Ball is a 32 y.o. male who presents to the emergency department with a chief complaint of left knee pain.  The patient reports that he slipped and fell at work last week.  He states that when he landed his left leg was twisted beneath him.  The patient states that he heard a pop during the injury.  He reports that he initially had some swelling, which is since improved.  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  He denies left hip or ankle pain, numbness, weakness, fever, or chills.  He has been taking ibuprofen with some relief of his symptoms.  Last dose was 8:30 PM last night.  He has a history of a left ACL repair that was performed by Dr. Romeo Apple.  The history is provided by the patient. No language interpreter was used.    History reviewed. No pertinent past medical history.  Patient Active Problem List   Diagnosis Date Noted  . Joint contracture of the lower leg 01/08/2011  . ACL injury tear 12/17/2010  . Acute medial meniscus tear of left knee 10/23/2010    Past Surgical History:  Procedure Laterality Date  . KNEE SURGERY Left   . leftmiddle finger          Home Medications    Prior to Admission medications   Medication Sig Start Date End Date Taking? Authorizing Provider  cyclobenzaprine (FLEXERIL) 10 MG tablet Take 1 tablet (10 mg total) by mouth 2 (two) times daily as needed for muscle spasms. 03/15/18   Kewanda Poland A, PA-C  HYDROcodone-acetaminophen (NORCO) 7.5-325 MG per tablet Take 1 tablet by mouth every 4 (four) hours as needed.      [provider]  IBUPROFEN PO Take by mouth.      [provider]  oxyCODONE-acetaminophen  (PERCOCET) 5-325 MG per tablet Take 1 tablet by mouth every 6 (six) hours as needed.      [provider]    Family History Family History  Problem Relation Age of Onset  . Diabetes Unknown     Social History Social History   Tobacco Use  . Smoking status: Never Smoker  . Smokeless tobacco: Never Used  Substance Use Topics  . Alcohol use: No  . Drug use: No     Allergies   Patient has no known allergies.   Review of Systems Review of Systems  Constitutional: Negative for activity change, chills and fever.  Respiratory: Negative for shortness of breath.   Cardiovascular: Negative for chest pain.  Gastrointestinal: Negative for abdominal pain.  Musculoskeletal: Positive for arthralgias, gait problem and myalgias. Negative for back pain, joint swelling, neck pain and neck stiffness.  Skin: Negative for rash.  Neurological: Negative for weakness and numbness.     Physical Exam Updated Vital Signs BP 134/82   Pulse (!) 105   Temp 98.7 F (37.1 C) (Oral)   Resp 18   Ht 6' (1.829 m)   Wt 90.7 kg   SpO2 97%   BMI 27.12 kg/m   Physical Exam  Constitutional: He appears well-developed.  HENT:  Head: Normocephalic.  Eyes:  Conjunctivae are normal.  Neck: Neck supple.  Cardiovascular: Normal rate and regular rhythm.  No murmur heard. Pulmonary/Chest: Effort normal.  Abdominal: Soft. He exhibits no distension.  Musculoskeletal: He exhibits tenderness. He exhibits no edema or deformity.  Tender to palpation over the medial joint line of the left knee.  No tenderness to the lateral joint line, quadriceps or patellar tendons.  Patella is nontender to palpation.  5 out of 5 strength against resistance with dorsiflexion plantarflexion.  Sensation is intact and equal throughout the bilateral lower extremities.  DP pulses are 2+ and symmetric.  Full active and passive range of motion of the left hip and ankle.  Patient has full active and passive range of motion of the  left knee, but popping sensation is noted when the patient attempts to flex the knee at approximately 90 degrees.  No increased pain with passive versus active range of motion.  Negative anterior posterior drawer test.  Negative varus test.  Increased pain with valgus stress test.  Able to bear weight on the bilateral lower extremities.  Antalgic gait.  No obvious swelling, erythema, or warmth to the extremity.  Neurological: He is alert.  Skin: Skin is warm and dry.  Psychiatric: His behavior is normal.  Nursing note and vitals reviewed.    ED Treatments / Results  Labs (all labs ordered are listed, but only abnormal results are displayed) Labs Reviewed - No data to display  EKG None  Radiology Dg Knee Complete 4 Views Left  Result Date: 03/15/2018 CLINICAL DATA:  Pain after twisting injury EXAM: LEFT KNEE - COMPLETE 4+ VIEW COMPARISON:  Left knee radiographs October 12, 2010 and left knee MRI Nov 06, 2010 FINDINGS: Frontal, lateral, and bilateral oblique views were obtained. There is no appreciable fracture or dislocation. There is a small joint effusion. There is no appreciable joint space narrowing or erosion. IMPRESSION: Small joint effusion. No fracture or dislocation. No appreciable arthropathy. Electronically Signed   By: Bretta Bang III M.D.   On: 03/15/2018 12:59    Procedures Procedures (including critical care time)  Medications Ordered in ED Medications - No data to display   Initial Impression / Assessment and Plan / ED Course  I have reviewed the triage vital signs and the nursing notes.  Pertinent labs & imaging results that were available during my care of the patient were reviewed by me and considered in my medical decision making (see chart for details).     32 year old male with a history of a left ACL tear presenting with left knee injury approximately 1 week ago.  On exam, the patient is tender over the medial joint line and has increased pain with valgus  stress test.  X-ray of the left knee with small joint effusion.  I suspect the patient has a medial meniscus tear versus MCL tear.  Doubt fracture, septic joint, or gout.  The patient has been ambulatory and declines crutches at this time.  We will place the patient in a knee sleeve and recommended rice therapy and follow-up with Dr. Romeo Apple with orthopedics.  He is agreeable to the plan at this time.  He is hemodynamically stable and in no acute distress.  He is safe for discharge home with outpatient follow-up.  Final Clinical Impressions(s) / ED Diagnoses   Final diagnoses:  Acute pain of left knee    ED Discharge Orders         Ordered    cyclobenzaprine (FLEXERIL) 10 MG tablet  2  times daily PRN     03/15/18 1332           Shenicka Sunderlin A, PA-C 03/15/18 1400    Donnetta Hutching, MD 03/15/18 [phone]

## 2018-03-15 NOTE — ED Triage Notes (Signed)
Patient c/o left knee pain. Patient reports slipping at work and twisted knee, per patient felt pop. Patient reports swelling and limited ROM. Per patient has had surgery on left knee in past. Patient reports taking ibuprofen with some relief-last dose last 8:30pm last night.

## 2018-03-15 NOTE — Discharge Instructions (Addendum)
Thank you for allowing me to care for you today in the Emergency Department.   Please call Dr. Mort Sawyers office to schedule a follow-up appointment regarding your left knee pain.  Take 600 mg of ibuprofen or 650 mg of Tylenol every 6 hours for pain control.  Flexeril as a muscle relaxer and can be taken 2 times per day for muscle pain or spasms.  Do not take this medication before you work or drive because it can make you drowsy.  Wear the knee sleeve as needed to provide compression to the left knee.  This can help with pain control.  Call Dr. Romeo Apple to schedule a follow-up appointment.  Return to the emergency department if you develop new or worsening symptoms including if you have another fall or injury, if the left knee starts giving out, or if you develop new numbness or weakness.

## 2019-07-31 ENCOUNTER — Ambulatory Visit
Admission: EM | Admit: 2019-07-31 | Discharge: 2019-07-31 | Disposition: A | Discharge status: Home / Self Care | Payer: Self-pay | Attending: Emergency Medicine | Admitting: Emergency Medicine

## 2019-07-31 ENCOUNTER — Inpatient Hospital Stay (HOSPITAL_COMMUNITY)
Admission: EM | Admit: 2019-07-31 | Discharge: 2019-08-04 | DRG: 854 | Disposition: A | Discharge status: Home / Self Care | Payer: Self-pay | Attending: Internal Medicine | Admitting: Internal Medicine

## 2019-07-31 ENCOUNTER — Other Ambulatory Visit: Payer: Self-pay

## 2019-07-31 ENCOUNTER — Emergency Department (HOSPITAL_COMMUNITY): Payer: Self-pay

## 2019-07-31 ENCOUNTER — Encounter (HOSPITAL_COMMUNITY): Payer: Self-pay | Admitting: *Deleted

## 2019-07-31 DIAGNOSIS — K047 Periapical abscess without sinus: Secondary | ICD-10-CM | POA: Diagnosis present

## 2019-07-31 DIAGNOSIS — R600 Localized edema: Secondary | ICD-10-CM

## 2019-07-31 DIAGNOSIS — Z833 Family history of diabetes mellitus: Secondary | ICD-10-CM

## 2019-07-31 DIAGNOSIS — E871 Hypo-osmolality and hyponatremia: Secondary | ICD-10-CM | POA: Diagnosis present

## 2019-07-31 DIAGNOSIS — Z20822 Contact with and (suspected) exposure to covid-19: Secondary | ICD-10-CM | POA: Diagnosis present

## 2019-07-31 DIAGNOSIS — I159 Secondary hypertension, unspecified: Secondary | ICD-10-CM | POA: Diagnosis present

## 2019-07-31 DIAGNOSIS — E876 Hypokalemia: Secondary | ICD-10-CM | POA: Diagnosis present

## 2019-07-31 DIAGNOSIS — A419 Sepsis, unspecified organism: Secondary | ICD-10-CM

## 2019-07-31 DIAGNOSIS — R030 Elevated blood-pressure reading, without diagnosis of hypertension: Secondary | ICD-10-CM | POA: Diagnosis present

## 2019-07-31 DIAGNOSIS — R739 Hyperglycemia, unspecified: Secondary | ICD-10-CM | POA: Diagnosis present

## 2019-07-31 DIAGNOSIS — A412 Sepsis due to unspecified staphylococcus: Principal | ICD-10-CM | POA: Diagnosis present

## 2019-07-31 DIAGNOSIS — K122 Cellulitis and abscess of mouth: Secondary | ICD-10-CM | POA: Diagnosis present

## 2019-07-31 LAB — CBC WITH DIFFERENTIAL/PLATELET
Abs Immature Granulocytes: 0 10*3/uL (ref 0.00–0.07)
Band Neutrophils: 10 %
Basophils Absolute: 0 10*3/uL (ref 0.0–0.1)
Basophils Relative: 0 %
Blasts: 0 %
Eosinophils Absolute: 0 10*3/uL (ref 0.0–0.5)
Eosinophils Relative: 0 %
HCT: 51.2 % (ref 39.0–52.0)
Hemoglobin: 18.3 g/dL — ABNORMAL HIGH (ref 13.0–17.0)
Lymphocytes Relative: 13 %
Lymphs Abs: 4.7 10*3/uL — ABNORMAL HIGH (ref 0.7–4.0)
MCH: 32.4 pg (ref 26.0–34.0)
MCHC: 35.7 g/dL (ref 30.0–36.0)
MCV: 90.8 fL (ref 80.0–100.0)
Metamyelocytes Relative: 0 %
Monocytes Absolute: 2.5 10*3/uL — ABNORMAL HIGH (ref 0.1–1.0)
Monocytes Relative: 7 %
Myelocytes: 0 %
Neutro Abs: 28.9 10*3/uL — ABNORMAL HIGH (ref 1.7–7.7)
Neutrophils Relative %: 70 %
Other: 0 %
Platelets: 297 10*3/uL (ref 150–400)
Promyelocytes Relative: 0 %
RBC: 5.64 MIL/uL (ref 4.22–5.81)
RDW: 11.9 % (ref 11.5–15.5)
WBC: 36.1 10*3/uL — ABNORMAL HIGH (ref 4.0–10.5)
nRBC: 0 % (ref 0.0–0.2)
nRBC: 0 /100 WBC

## 2019-07-31 LAB — RESPIRATORY PANEL BY RT PCR (FLU A&B, COVID)
Influenza A by PCR: NEGATIVE
Influenza B by PCR: NEGATIVE
SARS Coronavirus 2 by RT PCR: NEGATIVE

## 2019-07-31 LAB — BASIC METABOLIC PANEL
Anion gap: 17 — ABNORMAL HIGH (ref 5–15)
BUN: 23 mg/dL — ABNORMAL HIGH (ref 6–20)
CO2: 23 mmol/L (ref 22–32)
Calcium: 9.7 mg/dL (ref 8.9–10.3)
Chloride: 90 mmol/L — ABNORMAL LOW (ref 98–111)
Creatinine, Ser: 1.14 mg/dL (ref 0.61–1.24)
GFR calc Af Amer: >60 mL/min (ref >60)
GFR calc non Af Amer: >60 mL/min (ref >60)
Glucose, Bld: 162 mg/dL — ABNORMAL HIGH (ref 70–99)
Potassium: 3.1 mmol/L — ABNORMAL LOW (ref 3.5–5.1)
Sodium: 130 mmol/L — ABNORMAL LOW (ref 135–145)

## 2019-07-31 LAB — LACTIC ACID, PLASMA
Lactic Acid, Venous: 2.2 mmol/L (ref 0.5–1.9)
Lactic Acid, Venous: 1.2 mmol/L (ref 0.5–1.9)

## 2019-07-31 LAB — MAGNESIUM: Magnesium: 2 mg/dL (ref 1.7–2.4)

## 2019-07-31 MED ORDER — ONDANSETRON HCL 4 MG/2ML IJ SOLN: 4.0000 mg | Freq: Four times a day (QID) | INTRAMUSCULAR | Status: DC | PRN

## 2019-07-31 MED ORDER — SODIUM CHLORIDE 0.9 % IV SOLN: INTRAVENOUS | Status: DC

## 2019-07-31 MED ORDER — INSULIN ASPART 100 UNIT/ML ~~LOC~~ SOLN: 0.0000 [IU] | Freq: Three times a day (TID) | SUBCUTANEOUS | Status: DC

## 2019-07-31 MED ORDER — ONDANSETRON HCL 4 MG/2ML IJ SOLN
4.0000 mg | Freq: Once | INTRAMUSCULAR | Status: AC
  Administered 2019-07-31: 4 mg via INTRAVENOUS
  Filled 2019-07-31: qty 2

## 2019-07-31 MED ORDER — SODIUM CHLORIDE 0.9 % IV BOLUS
1000.0000 mL | Freq: Once | INTRAVENOUS | Status: AC
  Administered 2019-07-31: 1000 mL via INTRAVENOUS

## 2019-07-31 MED ORDER — MORPHINE SULFATE (PF) 4 MG/ML IV SOLN
4.0000 mg | Freq: Once | INTRAVENOUS | Status: AC
  Administered 2019-07-31: 4 mg via INTRAVENOUS
  Filled 2019-07-31: qty 1

## 2019-07-31 MED ORDER — MORPHINE SULFATE (PF) 4 MG/ML IV SOLN
4.0000 mg | Freq: Once | INTRAVENOUS | Status: AC
  Administered 2019-07-31: 16:00:00 4 mg via INTRAVENOUS
  Filled 2019-07-31: qty 1

## 2019-07-31 MED ORDER — ACETAMINOPHEN 500 MG PO TABS
1000.0000 mg | ORAL_TABLET | Freq: Once | ORAL | Status: AC
  Administered 2019-07-31: 1000 mg via ORAL
  Filled 2019-07-31: qty 2

## 2019-07-31 MED ORDER — POLYETHYLENE GLYCOL 3350 17 G PO PACK: 17.0000 g | PACK | Freq: Every day | ORAL | Status: DC | PRN

## 2019-07-31 MED ORDER — ONDANSETRON HCL 4 MG PO TABS: 4.0000 mg | ORAL_TABLET | Freq: Four times a day (QID) | ORAL | Status: DC | PRN

## 2019-07-31 MED ORDER — MORPHINE SULFATE (PF) 2 MG/ML IV SOLN
2.0000 mg | Freq: Once | INTRAVENOUS | Status: AC
  Administered 2019-08-01: 2 mg via INTRAVENOUS
  Filled 2019-07-31: qty 1

## 2019-07-31 MED ORDER — IOHEXOL 300 MG/ML  SOLN
75.0000 mL | Freq: Once | INTRAMUSCULAR | Status: AC | PRN
  Administered 2019-07-31: 75 mL via INTRAVENOUS

## 2019-07-31 MED ORDER — INSULIN ASPART 100 UNIT/ML ~~LOC~~ SOLN: 0.0000 [IU] | Freq: Every day | SUBCUTANEOUS | Status: DC

## 2019-07-31 MED ORDER — POTASSIUM CHLORIDE 10 MEQ/100ML IV SOLN
10.0000 meq | INTRAVENOUS | Status: AC
  Administered 2019-07-31: 10 meq via INTRAVENOUS
  Filled 2019-07-31: qty 100

## 2019-07-31 MED ORDER — SODIUM CHLORIDE 0.9 % IV SOLN
3.0000 g | Freq: Once | INTRAVENOUS | Status: AC
  Administered 2019-07-31: 3 g via INTRAVENOUS
  Filled 2019-07-31: qty 8

## 2019-07-31 MED ORDER — ACETAMINOPHEN 650 MG RE SUPP: 650.0000 mg | Freq: Four times a day (QID) | RECTAL | Status: DC | PRN

## 2019-07-31 MED ORDER — ACETAMINOPHEN 325 MG PO TABS
650.0000 mg | ORAL_TABLET | Freq: Four times a day (QID) | ORAL | Status: DC | PRN
  Administered 2019-07-31 – 2019-08-03 (×3): 650 mg via ORAL
  Filled 2019-07-31 (×3): qty 2

## 2019-07-31 MED ORDER — POTASSIUM CHLORIDE CRYS ER 20 MEQ PO TBCR
40.0000 meq | EXTENDED_RELEASE_TABLET | Freq: Two times a day (BID) | ORAL | Status: DC
  Administered 2019-08-01 – 2019-08-03 (×4): 40 meq via ORAL
  Filled 2019-07-31 (×4): qty 2

## 2019-07-31 MED ORDER — CLINDAMYCIN PHOSPHATE 600 MG/50ML IV SOLN
600.0000 mg | Freq: Once | INTRAVENOUS | Status: AC
  Administered 2019-07-31: 18:00:00 600 mg via INTRAVENOUS
  Filled 2019-07-31: qty 50

## 2019-07-31 MED ORDER — OXYCODONE HCL 5 MG PO TABS
5.0000 mg | ORAL_TABLET | ORAL | Status: DC | PRN
  Administered 2019-07-31 – 2019-08-04 (×9): 5 mg via ORAL
  Filled 2019-07-31 (×9): qty 1

## 2019-07-31 MED ORDER — AMOXICILLIN-POT CLAVULANATE 875-125 MG PO TABS: 1.0000 | ORAL_TABLET | Freq: Two times a day (BID) | ORAL | 0 refills | Status: DC

## 2019-07-31 NOTE — Discharge Instructions (Addendum)
Patient to take antibiotic as prescribed To follow-up with primary care return or go to ED for worsening of symptoms

## 2019-07-31 NOTE — Plan of Care (Signed)
  Problem: Education: Goal: Knowledge of General Education information will improve Description: Including pain rating scale, medication(s)/side effects and non-pharmacologic comfort measures Outcome: Progressing   Problem: Clinical Measurements: Goal: Diagnostic test results will improve Outcome: Progressing   Problem: Nutrition: Goal: Adequate nutrition will be maintained Outcome: Progressing   Problem: Pain Managment: Goal: General experience of comfort will improve Outcome: Progressing   

## 2019-07-31 NOTE — ED Provider Notes (Addendum)
[facility]    CSN: [id number] Arrival date & time: 07/31/19  1306      History   Chief Complaint Chief Complaint  Patient presents with  . facial swelling    HPI Jordan Ball is a 34 y.o. male.   Who presents to the urgent care with a complaint of right jaw pain and swelling for the past 3 days.  Pain radiated to his throat.  Denies any precipitating event.  Use OTC Tylenol with mild relief.  Symptoms are made worse with eating.  Denies similar symptoms in the past.  Denies chills,  nausea, vomiting, diarrhea, chest tightness, chest pain, confusion.   The history is provided by the patient. No language interpreter was used.    History reviewed. No pertinent past medical history.  Patient Active Problem List   Diagnosis Date Noted  . Joint contracture of the lower leg 01/08/2011  . ACL injury tear 12/17/2010  . Acute medial meniscus tear of left knee 10/23/2010    Past Surgical History:  Procedure Laterality Date  . KNEE SURGERY Left   . leftmiddle finger         Home Medications    Prior to Admission medications   Medication Sig Start Date End Date Taking? Authorizing Provider  FLUoxetine (PROZAC) 10 MG capsule Take 10 mg by mouth daily.   Yes [provider]  amoxicillin-clavulanate (AUGMENTIN) 875-125 MG tablet Take 1 tablet by mouth every 12 (twelve) hours. 07/31/19   Jaecion Dempster, Zachery Dakins, FNP  cyclobenzaprine (FLEXERIL) 10 MG tablet Take 1 tablet (10 mg total) by mouth 2 (two) times daily as needed for muscle spasms. 03/15/18   McDonald, Mia A, PA-C  HYDROcodone-acetaminophen (NORCO) 7.5-325 MG per tablet Take 1 tablet by mouth every 4 (four) hours as needed.      [provider]  IBUPROFEN PO Take by mouth.      [provider]  oxyCODONE-acetaminophen (PERCOCET) 5-325 MG per tablet Take 1 tablet by mouth every 6 (six) hours as needed.      [provider]    Family History Family History  Problem Relation  Age of Onset  . Diabetes Other   . Healthy Mother     Social History Social History   Tobacco Use  . Smoking status: Never Smoker  . Smokeless tobacco: Never Used  Substance Use Topics  . Alcohol use: No  . Drug use: No     Allergies   Patient has no known allergies.   Review of Systems Review of Systems  Constitutional: Positive for fever.  HENT: Positive for facial swelling.        Jaw swelling  Respiratory: Negative.   Cardiovascular: Negative.   All other systems reviewed and are negative.    Physical Exam Triage Vital Signs ED Triage Vitals  Enc Vitals Group     BP 07/31/19 1328 126/82     Pulse Rate 07/31/19 1328 (!) 132     Resp 07/31/19 1328 16     Temp 07/31/19 1328 99.2 F (37.3 C)     Temp Source 07/31/19 1328 Oral     SpO2 07/31/19 1328 94 %     Weight --      Height --      Head Circumference --      Peak Flow --      Pain Score 07/31/19 1334 8     Pain Loc --      Pain Edu? --  Excl. in GC? --    No data found.  Updated Vital Signs BP 126/82 (BP Location: Right Arm)   Pulse (!) 132   Temp 99.2 F (37.3 C) (Oral)   Resp 16   SpO2 94%   Visual Acuity Right Eye Distance:   Left Eye Distance:   Bilateral Distance:    Right Eye Near:   Left Eye Near:    Bilateral Near:     Physical Exam Vitals and nursing note reviewed.  Constitutional:      General: He is not in acute distress.    Appearance: Normal appearance. He is normal weight. He is not ill-appearing, toxic-appearing or diaphoretic.  HENT:     Head: Normocephalic and atraumatic.     Jaw: Tenderness, swelling and pain on movement present.     Salivary Glands: Right salivary gland is diffusely enlarged and tender.      Mouth/Throat:     Mouth: Mucous membranes are moist.     Pharynx: Oropharynx is clear.  Cardiovascular:     Rate and Rhythm: Tachycardia present. Rhythm irregular.     Heart sounds: No murmur. No gallop.   Pulmonary:     Effort: Pulmonary effort is  normal. No respiratory distress.     Breath sounds: Normal breath sounds. No stridor. No wheezing, rhonchi or rales.  Chest:     Chest wall: No tenderness.  Neurological:     Mental Status: He is alert.      UC Treatments / Results  Labs (all labs ordered are listed, but only abnormal results are displayed) Labs Reviewed - No data to display  EKG   Radiology No results found.  Procedures Procedures (including critical care time)  Medications Ordered in UC Medications - No data to display  Initial Impression / Assessment and Plan / UC Course  I have reviewed the triage vital signs and the nursing notes.  Pertinent labs & imaging results that were available during my care of the patient were reviewed by me and considered in my medical decision making (see chart for details).   Patient is stable at discharge. Advised patient to take antibiotic as prescribed Follow-up with primary care, go to ED or return for worsening of symptoms   Final Clinical Impressions(s) / UC Diagnoses   Final diagnoses:  Salivary gland swelling     Discharge Instructions     Patient to take antibiotic as prescribed To follow-up with primary care return or go to ED for worsening of symptoms    ED Prescriptions    Medication Sig Dispense Auth. Provider   amoxicillin-clavulanate (AUGMENTIN) 875-125 MG tablet Take 1 tablet by mouth every 12 (twelve) hours. 14 tablet Linford Quintela, Darrelyn Hillock, FNP     PDMP not reviewed this encounter.   Emerson Monte, FNP 07/31/19 1404    Emerson Monte, FNP 08/04/19 0925    Emerson Monte, FNP 08/04/19 [phone]

## 2019-07-31 NOTE — ED Notes (Signed)
CRITICAL VALUE ALERT  Critical Value:  Lactic Acid 2.2  Date & Time Notifed:  07/31/19 & 1638  Provider Notified: Jodi Geralds PA  Orders Received/Actions taken: EDP notified

## 2019-07-31 NOTE — ED Triage Notes (Signed)
Pt presents to UC w/ c/o swelling in face on chin and throat x3 days. Pt states it is difficult to eat solid food. Pt states he is able to swallow liquids but feels nauseous. Has been vomiting.

## 2019-07-31 NOTE — H&P (Signed)
History and Physical  [name] YQM:[id number] DOB: 19-May-1986 DOA: 07/31/2019  Referring physician: Jodi Geralds, PA-C, ED provider PCP: [facility]  Outpatient Specialists: none  Patient Coming From: home  Chief Complaint: swelling under jaw  HPI: Jordan Ball is a 34 y.o. male with a history of bad dentition presents with jaw swelling, nausea, vomiting that started Wednesday and has been progressing.  Patient has been intolerant of food or liquids.  No palliating or provoking factors.  He has been trying to keep hydrated with liquids.  He describes fevers, chills.  He went to the urgent care earlier today and was given Augmentin.  Because he still felt ill, he presented to the hospital for further evaluation.  Emergency Department Course: CT scan shows Ludwick's angina with 2.7 x 2.1 abscess.  White count 36 with a lactic acid of 2.2.  Sodium 130, potassium 3.1, bicarb normal.  Creatinine 1.14 (which appears to be his baseline)  Review of Systems:   Pt denies any nausea, vomiting, diarrhea, constipation, abdominal pain, shortness of breath, dyspnea on exertion, orthopnea, cough, wheezing, palpitations, headache, vision changes, lightheadedness, dizziness, melena, rectal bleeding.  Review of systems are otherwise negative  History reviewed. No pertinent past medical history. Past Surgical History:  Procedure Laterality Date  . KNEE SURGERY Left   . leftmiddle finger     Social History:  reports that he has never smoked. He has never used smokeless tobacco. He reports that he does not drink alcohol or use drugs. Patient lives at home  No Known Allergies  Family History  Problem Relation Age of Onset  . Diabetes Other   . Healthy Mother       Prior to Admission medications   Medication Sig Start Date End Date Taking? Authorizing Provider  acetaminophen (TYLENOL) 500 MG tablet Take 500 mg by mouth every 6 (six) hours as needed for mild pain or  moderate pain.   Yes [provider]  amoxicillin-clavulanate (AUGMENTIN) 875-125 MG tablet Take 1 tablet by mouth every 12 (twelve) hours. 07/31/19  Yes Avegno, Zachery Dakins, FNP  FLUoxetine (PROZAC) 40 MG capsule Take 40 mg by mouth daily. 06/29/19  Yes [provider]    Physical Exam: BP 137/89   Pulse 99   Temp 99.1 F (37.3 C) (Oral)   Resp (!) 32   Ht 6' (1.829 m)   Wt 90.7 kg   SpO2 94%   BMI 27.12 kg/m   . General: Young male. Awake and alert and oriented x3. No acute cardiopulmonary distress.  Marland Kitchen HEENT: Normocephalic atraumatic.  Right and left ears normal in appearance.  Pupils equal, round, reactive to light. Extraocular muscles are intact. Sclerae anicteric and noninjected.  Moist mucosal membranes.  Patient with significant trismus.  There is significant tenderness and swelling to the submandibular aspect of the patient's jaw and extends down the patient's neck. . Neck: Neck supple without lymphadenopathy. No carotid bruits. No masses palpated.  . Cardiovascular: Regular rate with normal S1-S2 sounds. No murmurs, rubs, gallops auscultated. No JVD.  Marland Kitchen Respiratory: Good respiratory effort with no wheezes, rales, rhonchi. Lungs clear to auscultation bilaterally.  No accessory muscle use. . Abdomen: Soft, nontender, nondistended. Active bowel sounds. No masses or hepatosplenomegaly  . Skin: No rashes, lesions, or ulcerations.  Dry, warm to touch. 2+ dorsalis pedis and radial pulses. . Musculoskeletal: No calf or leg pain. All major joints not erythematous nontender.  No upper or lower joint deformation.  Good ROM.  No contractures  . Psychiatric: Intact judgment and insight. Pleasant and cooperative. . Neurologic: No focal neurological deficits. Strength is 5/5 and symmetric in upper and lower extremities.  Cranial nerves II through XII are grossly intact.           Labs on Admission: I have personally reviewed following labs and imaging studies  CBC: Recent  Labs  Lab 07/31/19 1540  WBC 36.1*  NEUTROABS 28.9*  HGB 18.3*  HCT 51.2  MCV 90.8  PLT 297   Basic Metabolic Panel: Recent Labs  Lab 07/31/19 1540  NA 130*  K 3.1*  CL 90*  CO2 23  GLUCOSE 162*  BUN 23*  CREATININE 1.14  CALCIUM 9.7   GFR: Estimated Creatinine Clearance: 101.2 mL/min (by C-G formula based on SCr of 1.14 mg/dL). Liver Function Tests: No results for input(s): AST, ALT, ALKPHOS, BILITOT, PROT, ALBUMIN in the last 168 hours. No results for input(s): LIPASE, AMYLASE in the last 168 hours. No results for input(s): AMMONIA in the last 168 hours. Coagulation Profile: No results for input(s): INR, PROTIME in the last 168 hours. Cardiac Enzymes: No results for input(s): CKTOTAL, CKMB, CKMBINDEX, TROPONINI in the last 168 hours. BNP (last 3 results) No results for input(s): PROBNP in the last 8760 hours. HbA1C: No results for input(s): HGBA1C in the last 72 hours. CBG: No results for input(s): GLUCAP in the last 168 hours. Lipid Profile: No results for input(s): CHOL, HDL, LDLCALC, TRIG, CHOLHDL, LDLDIRECT in the last 72 hours. Thyroid Function Tests: No results for input(s): TSH, T4TOTAL, FREET4, T3FREE, THYROIDAB in the last 72 hours. Anemia Panel: No results for input(s): VITAMINB12, FOLATE, FERRITIN, TIBC, IRON, RETICCTPCT in the last 72 hours. Urine analysis: No results found for: COLORURINE, APPEARANCEUR, LABSPEC, PHURINE, GLUCOSEU, HGBUR, BILIRUBINUR, KETONESUR, PROTEINUR, UROBILINOGEN, NITRITE, LEUKOCYTESUR Sepsis Labs: @LABRCNTIP (procalcitonin:4,lacticidven:4) )No results found for this or any previous visit (from the past 240 hour(s)).   Radiological Exams on Admission: CT Soft Tissue Neck W Contrast  Result Date: 07/31/2019 CLINICAL DATA:  Throat pain, swelling in right side of jaw, difficult to speak and swallow, vomiting for 4 days. EXAM: CT NECK WITH CONTRAST TECHNIQUE: Multidetector CT imaging of the neck was performed using the standard  protocol following the bolus administration of intravenous contrast. CONTRAST:  28mL OMNIPAQUE IOHEXOL 300 MG/ML  SOLN COMPARISON:  No pertinent prior studies available for comparison. FINDINGS: Motion degraded examination. Pharynx and larynx: There is prominent soft tissue swelling centered along the posterior right mandible at site of a carious posterior right mandibular molar. There is subtle periapical lucency surrounding this carious posterior right mandibular molar. here is circumscribed hypodensity centered at this site along both the superficial and deep aspects of the mandible measuring 2.7 x 2.1 x 2.1 cm likely reflecting soft tissue abscess (series 2, image 44) (series 4, image 47). There is additional surrounding perimandibular phlegmon. Edema tracks into the right floor of mouth were there is associated myositis (series 4, image 37). Edema also extends more inferiorly within the neck. Edema also extends to the submucosal right pharyngeal and parapharyngeal regions. The oropharyngeal airway remains patent. The larynx is unremarkable. Salivary glands: Likely reactive edema and swelling of the right submandibular gland. The bilateral parotid and left submandibular glands are unremarkable. Thyroid: No abnormality Lymph nodes: Upper cervical chain lymphadenopathy (greater on the right), likely reactive. Vascular: The major vascular structures of the neck appear patent. Limited intracranial: No abnormality identified. Visualized orbits: Visualized orbits demonstrate no acute abnormality. Mastoids and visualized paranasal sinuses:  Moderate-sized right maxillary sinus mucous retention cyst. No significant mastoid effusion. Skeleton: No acute bony abnormality. Upper chest: No consolidation within the imaged lung apices. These results were called by telephone at the time of interpretation on 07/31/2019 at 5:44 pm to provider PA [facility] , who verbally acknowledged these results. IMPRESSION: Perimandibular,  floor of mouth and upper neck soft tissue infection which appears centered along the right posterior mandible where there is a carious posterior right mandibular molar with periapical lucency. Circumscribed hypodensity along the superficial and deep aspects of the mandible at this site measuring 2.7 x 2.1 x 2.1 cm likely reflecting soft tissue abscess. Edema not only tracks into the floor of mouth but also into the submucosal right pharyngeal and parapharyngeal regions. The oropharyngeal airway remains patent. Upper cervical lymphadenopathy, likely reactive. Electronically Signed   By: Kellie Simmering DO   On: 07/31/2019 17:45    EKG: Independently reviewed.  Sinus tachycardia with right axis deviation.  No acute ST changes.  Assessment/Plan: Principal Problem:   Sepsis ([place]) Active Problems:   Ludwig's angina   Elevated BP without diagnosis of hypertension   Hyperglycemia   Hypokalemia   Hyponatremia    This patient was discussed with the ED physician, including pertinent vitals, physical exam findings, labs, and imaging.  We also discussed care given by the ED provider.  1. Sepsis a. Endorgan dysfunction includes severe tachycardia into the 130s, tachypnea, lactic acidosis, white blood count of 36. b. Patient fluid resuscitated with good results c. Admit to [employer] d. Blood cultures pending e. Patient on Unasyn per maxillofacial surgery. 2. Ludewig's angina a. Maxillofacial surgery consulted by ER b. Continue Unasyn c. Blood cultures pending d. Pain control e. IV fluids f. N.p.o. after midnight as he will be for surgery tomorrow g. No anticoagulation given 3. Elevated blood pressure a. Monitor for now b. May need to start antihypertensives if persistent 4. Hyperglycemia a. Per patient no history of diabetes b. Check hemoglobin A1c c. Sliding scale insulin 5. Hyponatremia and hypokalemia a. Likely secondary to vomiting.  We will replace  DVT prophylaxis: SCDs Consultants:  Maxillofacial surgery Code Status: Full code Family Communication: None Disposition Plan: Patient should be able to return home following stabilization   Truett Mainland, DO

## 2019-07-31 NOTE — ED Provider Notes (Addendum)
[facility] EMERGENCY DEPARTMENT Provider Note   CSN: [id number] Arrival date & time: 07/31/19  1450     History Chief Complaint  Patient presents with  . Facial Swelling    Jordan Ball is a 34 y.o. male.  Jordan Ball is a 34 y.o. male who is otherwise healthy, presents to the emergency department for evaluation of swelling to the right jaw that has been present since Wednesday.  He reports swelling has progressively worsened and last night it became painful for him to swallow.  He denies sore throat.  States that he has had fevers up to 100 at home, has been taking Tylenol and ibuprofen with some improvement in fevers but has had worsening pain.  He also reports that he has been nauseated and vomiting throughout the week, states he has had no appetite and has been unable to keep anything down.  He denies specific dental pain but reports pain throughout his lower jaw radiating into his neck.  He has not noticed any overlying skin changes beneath his beard.  He reports that he went to urgent care today and they felt that he likely had a swollen and infected salivary gland and prescribed Augmentin and discharged him with PCP follow-up.  He states that after he tried to take the first dose of Augmentin he had an episode of vomitus that had some blood streaks in it so he came in for further evaluation.  Of note patient is tachycardic at 136 on arrival was similarly tachycardic at urgent care today with low-grade fever.  He denies any associated cough, chest pain or shortness of breath, despite nausea and vomiting he has not had any abdominal pain, diarrhea or constipation.  No other aggravating or alleviating factors.       History reviewed. No pertinent past medical history.  Patient Active Problem List   Diagnosis Date Noted  . Joint contracture of the lower leg 01/08/2011  . ACL injury tear 12/17/2010  . Acute medial meniscus tear of left knee 10/23/2010    Past Surgical  History:  Procedure Laterality Date  . KNEE SURGERY Left   . leftmiddle finger         Family History  Problem Relation Age of Onset  . Diabetes Other   . Healthy Mother     Social History   Tobacco Use  . Smoking status: Never Smoker  . Smokeless tobacco: Never Used  Substance Use Topics  . Alcohol use: No  . Drug use: No    Home Medications Prior to Admission medications   Medication Sig Start Date End Date Taking? Authorizing Provider  acetaminophen (TYLENOL) 500 MG tablet Take 500 mg by mouth every 6 (six) hours as needed for mild pain or moderate pain.   Yes [provider]  amoxicillin-clavulanate (AUGMENTIN) 875-125 MG tablet Take 1 tablet by mouth every 12 (twelve) hours. 07/31/19  Yes Avegno, Zachery Dakins, FNP  FLUoxetine (PROZAC) 40 MG capsule Take 40 mg by mouth daily. 06/29/19  Yes [provider]    Allergies    Patient has no known allergies.  Review of Systems   Review of Systems  Constitutional: Positive for chills and fever.  HENT: Positive for facial swelling and trouble swallowing. Negative for congestion.   Respiratory: Negative for cough and shortness of breath.   Cardiovascular: Negative for chest pain.  Gastrointestinal: Positive for nausea and vomiting.  Musculoskeletal: Positive for neck pain and neck stiffness.  Skin: Negative for color change and  rash.  All other systems reviewed and are negative.   Physical Exam Updated Vital Signs BP (!) 138/99 (BP Location: Right Arm)   Pulse (!) 136   Temp 99.1 F (37.3 C) (Oral)   Resp 20   Ht 6' (1.829 m)   Wt 90.7 kg   SpO2 97%   BMI 27.12 kg/m   Physical Exam Vitals and nursing note reviewed.  Constitutional:      General: He is not in acute distress.    Appearance: Normal appearance. He is well-developed and normal weight. He is ill-appearing. He is not diaphoretic.  HENT:     Head: Normocephalic and atraumatic.     Mouth/Throat:     Comments: Posterior oropharynx is  clear with slight erythema but no edema or exudates, uvula midline, no evidence of PTA, patient does have some trismus, it is difficult to fully evaluate his lower gums and jaw due to the trismus but I do not see any obvious intraoral abscess.  Mucous membranes somewhat dry.  Patient tolerating secretions. Eyes:     General:        Right eye: No discharge.        Left eye: No discharge.  Neck:     Comments: Very tender to palpation over the right submandibular region with palpable swelling, no overlying superficial skin changes beneath patient's beard to suggest cellulitis, patient also with mild tenderness and swelling on the left submandibular space.  No stridor Cardiovascular:     Rate and Rhythm: Regular rhythm. Tachycardia present.     Pulses: Normal pulses.     Heart sounds: Normal heart sounds. No murmur. No friction rub. No gallop.   Pulmonary:     Effort: Pulmonary effort is normal. No respiratory distress.     Breath sounds: Normal breath sounds.     Comments: Respirations equal and unlabored, patient able to speak in full sentences, lungs clear to auscultation bilaterally Abdominal:     General: Abdomen is flat. Bowel sounds are normal. There is no distension.     Palpations: Abdomen is soft. There is no mass.     Tenderness: There is no abdominal tenderness. There is no guarding.     Comments: Abdomen soft, nondistended, nontender to palpation in all quadrants without guarding or peritoneal signs  Musculoskeletal:        General: No deformity.     Cervical back: Tenderness present.  Skin:    General: Skin is warm and dry.  Neurological:     Mental Status: He is alert and oriented to person, place, and time.     Coordination: Coordination normal.  Psychiatric:        Mood and Affect: Mood normal.        Behavior: Behavior normal.     ED Results / Procedures / Treatments   Labs (all labs ordered are listed, but only abnormal results are displayed) Labs Reviewed  BASIC  METABOLIC PANEL - Abnormal; Notable for the following components:      Result Value   Sodium 130 (*)    Potassium 3.1 (*)    Chloride 90 (*)    Glucose, Bld 162 (*)    BUN 23 (*)    Anion gap 17 (*)    All other components within normal limits  CBC WITH DIFFERENTIAL/PLATELET - Abnormal; Notable for the following components:   WBC 36.1 (*)    Hemoglobin 18.3 (*)    Neutro Abs 28.9 (*)    Lymphs Abs 4.7 (*)  Monocytes Absolute 2.5 (*)    All other components within normal limits  LACTIC ACID, PLASMA - Abnormal; Notable for the following components:   Lactic Acid, Venous 2.2 (*)    All other components within normal limits  CULTURE, BLOOD (ROUTINE X 2)  CULTURE, BLOOD (ROUTINE X 2)  RESPIRATORY PANEL BY RT PCR (FLU A&B, COVID)  LACTIC ACID, PLASMA    EKG EKG Interpretation  Date/Time:  Saturday July 31 2019 16:43:16 EST Ventricular Rate:  117 PR Interval:    QRS Duration: 110 QT Interval:  320 QTC Calculation: 447 R Axis:   132 Text Interpretation: Sinus tachycardia Prominent P waves, nondiagnostic Right axis deviation Borderline T wave abnormalities No old tracing to compare Confirmed by Mancel Bale [id number]) on 07/31/2019 4:50:01 PM   Radiology CT Soft Tissue Neck W Contrast  Result Date: 07/31/2019 CLINICAL DATA:  Throat pain, swelling in right side of jaw, difficult to speak and swallow, vomiting for 4 days. EXAM: CT NECK WITH CONTRAST TECHNIQUE: Multidetector CT imaging of the neck was performed using the standard protocol following the bolus administration of intravenous contrast. CONTRAST:  59mL OMNIPAQUE IOHEXOL 300 MG/ML  SOLN COMPARISON:  No pertinent prior studies available for comparison. FINDINGS: Motion degraded examination. Pharynx and larynx: There is prominent soft tissue swelling centered along the posterior right mandible at site of a carious posterior right mandibular molar. There is subtle periapical lucency surrounding this carious posterior right  mandibular molar. here is circumscribed hypodensity centered at this site along both the superficial and deep aspects of the mandible measuring 2.7 x 2.1 x 2.1 cm likely reflecting soft tissue abscess (series 2, image 44) (series 4, image 47). There is additional surrounding perimandibular phlegmon. Edema tracks into the right floor of mouth were there is associated myositis (series 4, image 37). Edema also extends more inferiorly within the neck. Edema also extends to the submucosal right pharyngeal and parapharyngeal regions. The oropharyngeal airway remains patent. The larynx is unremarkable. Salivary glands: Likely reactive edema and swelling of the right submandibular gland. The bilateral parotid and left submandibular glands are unremarkable. Thyroid: No abnormality Lymph nodes: Upper cervical chain lymphadenopathy (greater on the right), likely reactive. Vascular: The major vascular structures of the neck appear patent. Limited intracranial: No abnormality identified. Visualized orbits: Visualized orbits demonstrate no acute abnormality. Mastoids and visualized paranasal sinuses: Moderate-sized right maxillary sinus mucous retention cyst. No significant mastoid effusion. Skeleton: No acute bony abnormality. Upper chest: No consolidation within the imaged lung apices. These results were called by telephone at the time of interpretation on 07/31/2019 at 5:44 pm to provider PA [facility] , who verbally acknowledged these results. IMPRESSION: Perimandibular, floor of mouth and upper neck soft tissue infection which appears centered along the right posterior mandible where there is a carious posterior right mandibular molar with periapical lucency. Circumscribed hypodensity along the superficial and deep aspects of the mandible at this site measuring 2.7 x 2.1 x 2.1 cm likely reflecting soft tissue abscess. Edema not only tracks into the floor of mouth but also into the submucosal right pharyngeal and  parapharyngeal regions. The oropharyngeal airway remains patent. Upper cervical lymphadenopathy, likely reactive. Electronically Signed   By: Jackey Loge DO   On: 07/31/2019 17:45    Procedures .Critical Care Performed by: Dartha Lodge, PA-C Authorized by: Dartha Lodge, PA-C   Critical care provider statement:    Critical care time (minutes):  45   Critical care was necessary to treat or prevent imminent  or life-threatening deterioration of the following conditions:  Sepsis (Sepsis from ludwig's angina)   Critical care was time spent personally by me on the following activities:  Discussions with consultants, evaluation of patient's response to treatment, examination of patient, ordering and performing treatments and interventions, ordering and review of laboratory studies, ordering and review of radiographic studies, pulse oximetry, re-evaluation of patient's condition, obtaining history from patient or surrogate and review of old charts   (including critical care time)  Medications Ordered in ED Medications  Ampicillin-Sulbactam (UNASYN) 3 g in sodium chloride 0.9 % 100 mL IVPB (has no administration in time range)  sodium chloride 0.9 % bolus 1,000 mL (0 mLs Intravenous Stopped 07/31/19 1757)  ondansetron (ZOFRAN) injection 4 mg (4 mg Intravenous Given 07/31/19 1547)  morphine 4 MG/ML injection 4 mg (4 mg Intravenous Given 07/31/19 1549)  acetaminophen (TYLENOL) tablet 1,000 mg (1,000 mg Oral Given 07/31/19 1650)  iohexol (OMNIPAQUE) 300 MG/ML solution 75 mL (75 mLs Intravenous Contrast Given 07/31/19 1658)  clindamycin (CLEOCIN) IVPB 600 mg (600 mg Intravenous New Bag/Given 07/31/19 1813)  sodium chloride 0.9 % bolus 1,000 mL (1,000 mLs Intravenous New Bag/Given 07/31/19 1814)  morphine 4 MG/ML injection 4 mg (4 mg Intravenous Given 07/31/19 1809)    ED Course  I have reviewed the triage vital signs and the nursing notes.  Pertinent labs & imaging results that were available during my  care of the patient were reviewed by me and considered in my medical decision making (see chart for details).    MDM Rules/Calculators/A&P                      34 year old male presents with neck swelling worsening over the past 5 days, associated fevers up to 100 at home, tachycardic at 136 on arrival.  Patient has significant swelling and tenderness which is most notably over the right side of the neck and right submandibular space concerning for deep space infection.  The posterior oropharynx is clear and patient is protecting his airway he is able to speak and is tolerating secretions, he is not stridorous.  He does have trismus and it is difficult to fully assess the teeth along the lower jaw.  Will give IV fluids, pain and nausea medication, and will get basic labs, lactic acid and CT soft tissue neck to better assess for source of infection.  Patient's lab results significant for a white blood cell count of 36.1 with left shift, patient also has potassium of 3.1, sodium of 130, glucose of 162.  Anion gap of 17 likely in the setting of dehydration and lactic acidosis with lactic acid of 2.2.   Received call from radiologist regarding patient's CT scan which shows an infection likely stemming from the right lower posterior molars with inflammation around the right mandible with 2.7 x 2.1 x 2.1 cm abscess in the floor of the mouth consistent with Ludwick's angina, edema and inflammation also extends into the right pharyngeal and parapharyngeal spaces but there is no fluid collection noted within the neck.  Airway appears patent, and patient continues to protect his airway on exam.  I discussed these results with the patient and with his mother via phone.  Initially consulted Dr. Constance Holster with ENT who recommends clindamycin for antibiotics and discussion with Dr. Glenford Peers who is on-call for oral surgery and OMFS trauma.  I discussed the case with Dr. Glenford Peers who has reviewed patient's CT and will  plan to take the patient to the  OR for drainage in the morning, request that patient be converted over to Unasyn for antibiotic coverage and request medicine admission.  Case discussed with Dr. Gust Rung with [facility] hospitalist who will see and admit the patient for transfer to [facility].  Final Clinical Impression(s) / ED Diagnoses Final diagnoses:  Ludwig's angina    Rx / DC Orders ED Discharge Orders    None       Legrand Rams 07/31/19 1929    Legrand Rams 07/31/19 2002    Mancel Bale, MD 07/31/19 2326

## 2019-07-31 NOTE — ED Triage Notes (Signed)
Pt c/o swelling to right jaw area for the past few days, was seen at urgent care prior to arrival in er, given antibiotics, pt states that he threw up blood after taking the antibiotic. Pt states that he has been throwing up all week, note from urgent care was that pt denies any n/v,

## 2019-07-31 NOTE — ED Provider Notes (Signed)
  Face-to-face evaluation   History: He complains of swelling of the right jaw, for 3 days with nausea, vomiting, and feeling like his jaw swelling is getting worse.  He has not been having coughing or chest pain.  He states that he does not feel his teeth have been giving him problems.  There are no other known modifying factors.  Physical exam: Alert, calm and cooperative.  No stridor.  No respiratory distress.  Moderate swelling right mandible, and sublingual space.  No discrete adenopathy of the neck.  There is swelling of the right upper lateral neck.  He has mild trismus.  Medical screening examination/treatment/procedure(s) were conducted as a shared visit with non-physician practitioner(s) and myself.  I personally evaluated the patient during the encounter    Mancel Bale, MD 07/31/19 2326

## 2019-07-31 NOTE — ED Notes (Signed)
Also paged ENT Trauma ( Dr. Julien Girt) for Jordan Ball, [name]

## 2019-08-01 ENCOUNTER — Encounter (HOSPITAL_COMMUNITY)
Admission: EM | Disposition: A | Discharge status: Home / Self Care | Payer: Self-pay | Source: Home / Self Care | Attending: Internal Medicine

## 2019-08-01 ENCOUNTER — Inpatient Hospital Stay (HOSPITAL_COMMUNITY): Payer: Self-pay | Admitting: Certified Registered Nurse Anesthetist

## 2019-08-01 ENCOUNTER — Encounter (HOSPITAL_COMMUNITY): Payer: Self-pay

## 2019-08-01 DIAGNOSIS — A021 Salmonella sepsis: Secondary | ICD-10-CM

## 2019-08-01 HISTORY — PX: IRRIGATION AND DEBRIDEMENT ABSCESS: SHX5252

## 2019-08-01 LAB — BASIC METABOLIC PANEL
Anion gap: 14 (ref 5–15)
BUN: 14 mg/dL (ref 6–20)
CO2: 27 mmol/L (ref 22–32)
Calcium: 9 mg/dL (ref 8.9–10.3)
Chloride: 92 mmol/L — ABNORMAL LOW (ref 98–111)
Creatinine, Ser: 1.11 mg/dL (ref 0.61–1.24)
GFR calc Af Amer: >60 mL/min (ref >60)
GFR calc non Af Amer: >60 mL/min (ref >60)
Glucose, Bld: 97 mg/dL (ref 70–99)
Potassium: 3.8 mmol/L (ref 3.5–5.1)
Sodium: 133 mmol/L — ABNORMAL LOW (ref 135–145)

## 2019-08-01 LAB — CBC
HCT: 44.6 % (ref 39.0–52.0)
Hemoglobin: 15.6 g/dL (ref 13.0–17.0)
MCH: 32.4 pg (ref 26.0–34.0)
MCHC: 35 g/dL (ref 30.0–36.0)
MCV: 92.7 fL (ref 80.0–100.0)
Platelets: 291 10*3/uL (ref 150–400)
RBC: 4.81 MIL/uL (ref 4.22–5.81)
RDW: 12.1 % (ref 11.5–15.5)
WBC: 26.8 10*3/uL — ABNORMAL HIGH (ref 4.0–10.5)
nRBC: 0 % (ref 0.0–0.2)

## 2019-08-01 LAB — HEMOGLOBIN A1C
Hgb A1c MFr Bld: 5.5 % (ref 4.8–5.6)
Mean Plasma Glucose: 111.15 mg/dL

## 2019-08-01 LAB — HIV ANTIBODY (ROUTINE TESTING W REFLEX): HIV Screen 4th Generation wRfx: NONREACTIVE

## 2019-08-01 LAB — C-REACTIVE PROTEIN: CRP: 39.2 mg/dL — ABNORMAL HIGH (ref <1.0)

## 2019-08-01 SURGERY — MINOR INCISION AND DRAINAGE OF ABSCESS
Anesthesia: General

## 2019-08-01 MED ORDER — LIDOCAINE-EPINEPHRINE 2 %-1:100000 IJ SOLN
INTRAMUSCULAR | Status: AC
  Filled 2019-08-01: qty 1

## 2019-08-01 MED ORDER — LIDOCAINE HCL 2 % IJ SOLN
INTRAMUSCULAR | Status: AC
  Filled 2019-08-01: qty 20

## 2019-08-01 MED ORDER — DEXAMETHASONE SODIUM PHOSPHATE 10 MG/ML IJ SOLN
INTRAMUSCULAR | Status: AC
  Filled 2019-08-01: qty 1

## 2019-08-01 MED ORDER — MIDAZOLAM HCL 2 MG/2ML IJ SOLN
INTRAMUSCULAR | Status: AC
  Filled 2019-08-01: qty 2

## 2019-08-01 MED ORDER — PROPOFOL 10 MG/ML IV BOLUS
INTRAVENOUS | Status: DC | PRN
  Administered 2019-08-01: 120 mg via INTRAVENOUS
  Administered 2019-08-01: 200 mg via INTRAVENOUS

## 2019-08-01 MED ORDER — SODIUM CHLORIDE 0.9 % IV SOLN
3.0000 g | Freq: Four times a day (QID) | INTRAVENOUS | Status: DC
  Administered 2019-08-01 – 2019-08-04 (×13): 3 g via INTRAVENOUS
  Filled 2019-08-01: qty 3
  Filled 2019-08-01 (×2): qty 8
  Filled 2019-08-01 (×3): qty 3
  Filled 2019-08-01: qty 8
  Filled 2019-08-01: qty 3
  Filled 2019-08-01 (×2): qty 8
  Filled 2019-08-01 (×6): qty 3
  Filled 2019-08-01 (×4): qty 8

## 2019-08-01 MED ORDER — FENTANYL CITRATE (PF) 250 MCG/5ML IJ SOLN
INTRAMUSCULAR | Status: DC | PRN
  Administered 2019-08-01 (×2): 100 ug via INTRAVENOUS
  Administered 2019-08-01: 50 ug via INTRAVENOUS

## 2019-08-01 MED ORDER — OXYCODONE HCL 5 MG/5ML PO SOLN: 5.0000 mg | Freq: Once | ORAL | Status: DC | PRN

## 2019-08-01 MED ORDER — SUCCINYLCHOLINE CHLORIDE 200 MG/10ML IV SOSY
PREFILLED_SYRINGE | INTRAVENOUS | Status: DC | PRN
  Administered 2019-08-01: 140 mg via INTRAVENOUS

## 2019-08-01 MED ORDER — ACETAMINOPHEN 500 MG PO TABS: 1000.0000 mg | ORAL_TABLET | Freq: Once | ORAL | Status: DC | PRN

## 2019-08-01 MED ORDER — DEXMEDETOMIDINE HCL 200 MCG/2ML IV SOLN
INTRAVENOUS | Status: DC | PRN
  Administered 2019-08-01 (×2): 12 ug via INTRAVENOUS
  Administered 2019-08-01: 8 ug via INTRAVENOUS

## 2019-08-01 MED ORDER — BUPIVACAINE HCL (PF) 0.5 % IJ SOLN
INTRAMUSCULAR | Status: AC
  Filled 2019-08-01: qty 30

## 2019-08-01 MED ORDER — PROPOFOL 10 MG/ML IV BOLUS
INTRAVENOUS | Status: AC
  Filled 2019-08-01: qty 40

## 2019-08-01 MED ORDER — FENTANYL CITRATE (PF) 250 MCG/5ML IJ SOLN
INTRAMUSCULAR | Status: AC
  Filled 2019-08-01: qty 5

## 2019-08-01 MED ORDER — MIDAZOLAM HCL 2 MG/2ML IJ SOLN
INTRAMUSCULAR | Status: DC | PRN
  Administered 2019-08-01: 2 mg via INTRAVENOUS

## 2019-08-01 MED ORDER — PHENYLEPHRINE 40 MCG/ML (10ML) SYRINGE FOR IV PUSH (FOR BLOOD PRESSURE SUPPORT)
PREFILLED_SYRINGE | INTRAVENOUS | Status: DC | PRN
  Administered 2019-08-01: 80 ug via INTRAVENOUS

## 2019-08-01 MED ORDER — ACETAMINOPHEN 10 MG/ML IV SOLN: 1000.0000 mg | Freq: Once | INTRAVENOUS | Status: DC | PRN

## 2019-08-01 MED ORDER — DEXAMETHASONE SODIUM PHOSPHATE 10 MG/ML IJ SOLN
INTRAMUSCULAR | Status: DC | PRN
  Administered 2019-08-01: 10 mg via INTRAVENOUS

## 2019-08-01 MED ORDER — ACETAMINOPHEN 160 MG/5ML PO SOLN: 1000.0000 mg | Freq: Once | ORAL | Status: DC | PRN

## 2019-08-01 MED ORDER — POTASSIUM CHLORIDE 10 MEQ/100ML IV SOLN
INTRAVENOUS | Status: AC
  Administered 2019-08-01: 10 meq via INTRAVENOUS
  Filled 2019-08-01: qty 100

## 2019-08-01 MED ORDER — FENTANYL CITRATE (PF) 100 MCG/2ML IJ SOLN
25.0000 ug | INTRAMUSCULAR | Status: DC | PRN
  Administered 2019-08-01: 50 ug via INTRAVENOUS

## 2019-08-01 MED ORDER — LIDOCAINE 2% (20 MG/ML) 5 ML SYRINGE
INTRAMUSCULAR | Status: AC
  Filled 2019-08-01: qty 5

## 2019-08-01 MED ORDER — FENTANYL CITRATE (PF) 100 MCG/2ML IJ SOLN
INTRAMUSCULAR | Status: AC
  Filled 2019-08-01: qty 2

## 2019-08-01 MED ORDER — LIDOCAINE-EPINEPHRINE 2 %-1:100000 IJ SOLN
INTRAMUSCULAR | Status: DC | PRN
  Administered 2019-08-01: 7 mL

## 2019-08-01 MED ORDER — LACTATED RINGERS IV SOLN: INTRAVENOUS | Status: DC

## 2019-08-01 MED ORDER — ONDANSETRON HCL 4 MG/2ML IJ SOLN
INTRAMUSCULAR | Status: DC | PRN
  Administered 2019-08-01: 4 mg via INTRAVENOUS

## 2019-08-01 MED ORDER — PROPOFOL 10 MG/ML IV BOLUS
INTRAVENOUS | Status: AC
  Filled 2019-08-01: qty 20

## 2019-08-01 MED ORDER — 0.9 % SODIUM CHLORIDE (POUR BTL) OPTIME
TOPICAL | Status: DC | PRN
  Administered 2019-08-01: 1000 mL

## 2019-08-01 MED ORDER — LIDOCAINE HCL (PF) 2 % IJ SOLN
INTRAMUSCULAR | Status: DC | PRN
  Administered 2019-08-01: 5 mL

## 2019-08-01 MED ORDER — OXYCODONE HCL 5 MG PO TABS: 5.0000 mg | ORAL_TABLET | Freq: Once | ORAL | Status: DC | PRN

## 2019-08-01 MED ORDER — LIDOCAINE 2% (20 MG/ML) 5 ML SYRINGE
INTRAMUSCULAR | Status: DC | PRN
  Administered 2019-08-01: 60 mg via INTRAVENOUS

## 2019-08-01 MED ORDER — ONDANSETRON HCL 4 MG/2ML IJ SOLN
INTRAMUSCULAR | Status: AC
  Filled 2019-08-01: qty 2

## 2019-08-01 MED ORDER — PHENYLEPHRINE 40 MCG/ML (10ML) SYRINGE FOR IV PUSH (FOR BLOOD PRESSURE SUPPORT)
PREFILLED_SYRINGE | INTRAVENOUS | Status: AC
  Filled 2019-08-01: qty 10

## 2019-08-01 SURGICAL SUPPLY — 42 items
ATTRACTOMAT 16X20 MAGNETIC DRP (DRAPES) ×1 IMPLANT
BLADE SURG 15 STRL LF DISP TIS (BLADE) ×1 IMPLANT
BLADE SURG 15 STRL SS (BLADE) ×2
BUR CROSS CUT (BURR) ×2
BUR SRG MED 1.6XXCUT FSSR (BURR) IMPLANT
BUR SRG MED 2.1XXCUT FSSR (BURR) IMPLANT
BUR SURG 4X8 MED (BURR) IMPLANT
BURR SRG MED 1.6XXCUT FSSR (BURR) ×1
BURR SRG MED 2.1XXCUT FSSR (BURR)
BURR SURG 4X8 MED (BURR)
CANISTER SUCT 3000ML PPV (MISCELLANEOUS) ×2 IMPLANT
CNTNR URN SCR LID CUP LEK RST (MISCELLANEOUS) ×1 IMPLANT
CONT SPEC 4OZ STRL OR WHT (MISCELLANEOUS) ×2
COVER SURGICAL LIGHT HANDLE (MISCELLANEOUS) ×2 IMPLANT
COVER WAND RF STERILE (DRAPES) ×2 IMPLANT
DRAIN PENROSE 18X1/4 LTX STRL (DRAIN) ×1 IMPLANT
GAUZE 4X4 16PLY RFD (DISPOSABLE) ×3 IMPLANT
GAUZE PACKING FOLDED 2  STR (GAUZE/BANDAGES/DRESSINGS)
GAUZE PACKING FOLDED 2 STR (GAUZE/BANDAGES/DRESSINGS) IMPLANT
GAUZE SPONGE 4X4 12PLY STRL (GAUZE/BANDAGES/DRESSINGS) IMPLANT
GAUZE SPONGE 4X4 16PLY XRAY LF (GAUZE/BANDAGES/DRESSINGS) ×1 IMPLANT
GLOVE BIO SURGEON STRL SZ 6.5 (GLOVE) ×2 IMPLANT
GOWN STRL REUS W/ TWL LRG LVL3 (GOWN DISPOSABLE) ×2 IMPLANT
GOWN STRL REUS W/TWL LRG LVL3 (GOWN DISPOSABLE) ×4
KIT BASIN OR (CUSTOM PROCEDURE TRAY) ×2 IMPLANT
KIT TURNOVER KIT B (KITS) ×2 IMPLANT
NDL BLUNT 16X1.5 OR ONLY (NEEDLE) ×1 IMPLANT
NDL DENTAL 27 LONG (NEEDLE) ×2 IMPLANT
NEEDLE BLUNT 16X1.5 OR ONLY (NEEDLE) ×2 IMPLANT
NEEDLE DENTAL 27 LONG (NEEDLE) ×4 IMPLANT
NS IRRIG 1000ML POUR BTL (IV SOLUTION) ×2 IMPLANT
PACK EENT II TURBAN DRAPE (CUSTOM PROCEDURE TRAY) ×2 IMPLANT
PAD ARMBOARD 7.5X6 YLW CONV (MISCELLANEOUS) ×4 IMPLANT
SUT CHROMIC 3 0 PS 2 (SUTURE) ×3 IMPLANT
SUT VIC AB 3-0 FS2 27 (SUTURE) IMPLANT
SYR 50ML SLIP (SYRINGE) ×2 IMPLANT
SYR BULB 3OZ (MISCELLANEOUS) ×1 IMPLANT
TOOTHBRUSH ADULT (PERSONAL CARE ITEMS) IMPLANT
TOWEL GREEN STERILE (TOWEL DISPOSABLE) ×2 IMPLANT
TOWEL GREEN STERILE FF (TOWEL DISPOSABLE) ×2 IMPLANT
TUBE CONNECTING 12X1/4 (SUCTIONS) ×2 IMPLANT
WATER STERILE IRR 1000ML POUR (IV SOLUTION) ×2 IMPLANT

## 2019-08-01 NOTE — Transfer of Care (Signed)
Immediate Anesthesia Transfer of Care Note  Patient: Jordan Ball  Procedure(s) Performed: INCISION AND DRAINAGE OF RIGHT SUBMANDIBULAR SPACE ABSCESS AND EXTRACTION OF TEETH (N/A )  Patient Location: PACU  Anesthesia Type:General  Level of Consciousness: awake and patient cooperative  Airway & Oxygen Therapy: Patient Spontanous Breathing and Patient connected to face mask oxygen  Post-op Assessment: Report given to RN and Post -op Vital signs reviewed and stable  Post vital signs: Reviewed and stable  Last Vitals:  Vitals Value Taken Time  BP 125/85 08/01/19 0931  Temp    Pulse 110 08/01/19 0933  Resp 19 08/01/19 0933  SpO2 94 % 08/01/19 0933  Vitals shown include unvalidated device data.  Last Pain:  Vitals:   08/01/19 0725  TempSrc:   PainSc: 9       Patients Stated Pain Goal: 3 (08/01/19 0725)  Complications: No apparent anesthesia complications

## 2019-08-01 NOTE — Consult Note (Signed)
Consult Note   Name: Jordan Ball MRN: [id number]  Date:  07/31/2019            DOB: [birth year]  Reason for Consult: Ludwig's Angina  Past Medical History:  Unremarkable  Past Surgical History:   Past Surgical History:  Procedure Laterality Date  . KNEE SURGERY Left   . leftmiddle finger      Medications:   No current facility-administered medications on file prior to encounter.   Current Outpatient Medications on File Prior to Encounter  Medication Sig Dispense Refill  . acetaminophen (TYLENOL) 500 MG tablet Take 500 mg by mouth every 6 (six) hours as needed for mild pain or moderate pain.    Marland Kitchen amoxicillin-clavulanate (AUGMENTIN) 875-125 MG tablet Take 1 tablet by mouth every 12 (twelve) hours. 14 tablet 0  . FLUoxetine (PROZAC) 40 MG capsule Take 40 mg by mouth daily.      Allergies:  No Known Allergies  Social History:   Social History   Tobacco Use  . Smoking status: Never Smoker  . Smokeless tobacco: Never Used  Substance Use Topics  . Alcohol use: No  . Drug use: No    Review of System:   CONSTITUTIONAL: None  NERVOUS SYSTEM:None  EYES:None  EARS/NOSE/THROAT: Negative aside from HPI and current admission symptoms.   HEART:None  LUNG: None  STOMACH/BOWEL: None  GENITOURINARY: None  SKIN: None  MUSCLE/BONES: None   History of Present Illness:   Per records, Jordan Ball is a 34 y.o. male with a history of bad dentition presents with jaw swelling, nausea, vomiting that started Wednesday and has been progressing.  Patient has been intolerant of food or liquids.  No palliating or provoking factors.  He has been trying to keep hydrated with liquids.  He describes fevers, chills.  He went to the urgent care earlier today and was given Augmentin.  Because he still felt ill, he presented to the hospital for further evaluation. I was contacted by [facility] for discussion of CT findings of collection along the right posterior lingual  mandible concerning for ludwig's angina. On admission WBC was 36.1, lactic acid was 2.2, COVID negative, Glucose elevated to 162 and blood cultures were drawn X2. This morning his WBC had reduced to 26.2  and his electrolytes had somewhat normalized but remains mildly hyponatremic and hypochloremic. (Hgb A1c was 5.5). The patient was subsequently transferred to [facility] for management. The patient agrees with the above history. He reports that his mouth has become harder to open, but is unsure of a specific tooth that has been painful. He reports going to an urgent care earlier this week, but was told he had a salivary gland infection and was prescribed an antibiotics, but he isnt sure of the name. He reports not taking the antibiotic as it was to large to swallow. Later he experience nausea and noticed blood within it at which time he decided to go to the emergency department.     Objective:     Vitals with BMI 08/01/2019 07/31/2019 07/31/2019  Height - - -  Weight - - -  BMI - - -  Systolic 139 153 160  Diastolic 82 88 86  Pulse 101 114 107   Physical Exam  Neuro: CN II-XII grossly in tact. No evidence of sensory deficits of V3.  HEENT: Swelling of the right neck extending inferior to the level of the mandibular border. TTP. MIO 24mm limted by Pain, generalized poor dentition, with likely source  being 31 or 32.   CV: RRR  Pulm: NWB  Abd: soft, non-distended  Extremities: WWP, full range of motion  Assessment:   Right Submandibular space abscess with associated soft tissue cellulitis, likely source tooth #31 or 32.   Plan:     Patient will be posted to the OR for incision and drainage of right submandibular space infection and extraction of indicated teeth. R/B/A discussed with patient and he agrees with plan above. Will plan to obtain a CRP intra-op.       Post-op Instructions:  - Patient should maintain a soft diet until drains are removed - The patient will have drains placed  that will need to be irrigated by nursing staff TID until removed.  - Please trend CRP and CBC daily - Continue Unasyn while admitted, will likely transition to Augmentin at discharge pending cultures from OR.  - Please provide peridex mouth rinse twice daily while admitted and discharge with a 2 week supply.  - Defer pain management to primary team, but normal post I&D/extractions require Norco 5/325 for 3-4 days.   Follow up after discharge: Once cleared for discharge, Patient should follow up in 1 week at my office below. Please provide contact information to patient and ask him to contact our office to schedule and appointment.   Dr. Fabian Sharp  [facility]  [phone]  [address] [place], [place] [address]

## 2019-08-01 NOTE — Progress Notes (Signed)
PROGRESS NOTE  Jordan Ball  VZC:[id number] DOB: [birth year]  DOA: 07/31/2019 PCP: [facility]   Brief Narrative:  Jordan Ball is a 34 y.o. male with a history of bad dentition presenting with 4-day history of progressively worsening jaw swelling, nausea, vomiting associated with fever and chills with difficulty maintaining oral intake.  Patient was tachycardic, tachypneic with leukocytosis and lactic acidosis.  ED CT scan showed Ludwick's angina with abscess, leukocytosis and lactic acid level of 2.2  Recommend left facial surgery was consulted for incision and drainage and patient admitted to the hospitalist service for sepsis from Ludwick's angina Assessment & Plan:   Principal Problem:   Sepsis (HCC) Active Problems:   Ludwig's angina   Elevated BP without diagnosis of hypertension   Hyperglycemia   Hypokalemia   Hyponatremia  Sepsis -Source-Right Submandibular space abscess with associated soft tissue cellulitis, likely source tooth #31 or 32 -Presented with tachycardia in the 160s, tachypneic, leukocytosis with lactic acidosis -Continue IV Unasyn -Follow-up blood cultures and body fluid cultures -Supportive care including IV fluids with hemodynamic monitoring  Right submandibular space abscess/cellulitis -Source-likely tooth #31 or 32, per [facility] surgeon -Status post incision and drainage by [facility] surgeon 08/01/2019 -Postop instructions:       Patient should maintain a soft diet until drains are removed       Drainsirrigated by nursing staff TID until removed.        Trend CRP and CBC daily       Continue Unasyn while admitted, consider transition to Augmentin at discharge if              cultures from OR still pending       Peridex mouth rinse twice daily while admitted and at discharge with a 2 week supply       Schedule appointment at discharge to f/u with Dr Julien Girt in 1 week  Hyperglycemia No history of diabetes mellitus A1c 5.5% Follow  clinically  Elevated blood pressure Resolved No history of hypertension Likely pain related  Electrolyte imbalance Mild hyponatremia and hypokalemia-likely due to vomiting Repleted on admission Follow clinically  DVT prophylaxis: SCD  Code Status: Full code Family Communication: Full thought bedside Disposition Plan: Home   Consultants:  Oral surgeon, Dr. Lalla Brothers Procedures:  08/01/2019-INCISION AND DRAINAGE OF RIGHT SUBMANDIBULAR SPACE ABSCESS AND EXTRACTION OF TEETH  Antimicrobials:   IV Unasyn   Subjective: Denies fever chills, pain control adequate postoperatively  Objective:  Vitals:   08/01/19 0930 08/01/19 0945 08/01/19 0959 08/01/19 1008  BP: 125/85 124/83 125/89 123/85  Pulse: (!) 112 (!) 101 (!) 105 (!) 101  Resp: 18 20 17    Temp: 98.5 F (36.9 C)  98.5 F (36.9 C)   TempSrc:      SpO2: 99% 93% 93% 93%  Weight:      Height:        Intake/Output Summary (Last 24 hours) at 08/01/2019 1239 Last data filed at 08/01/2019 1100 Gross per 24 hour  Intake 4238.86 ml  Output 600 ml  Net 3638.86 ml   Filed Weights   07/31/19 1458  Weight: 90.7 kg    Examination:  General exam: Comfortable, no acute distress HEENT: Right jaw postoperative dressing noted Respiratory system: Clear to auscultation. Respiratory effort normal. Cardiovascular system: S1 & S2 heard, RRR. No JVD, murmurs, rubs, gallops or clicks. No pedal edema. Gastrointestinal system: Abdomen is nondistended, soft and nontender. No organomegaly or masses felt. Normal bowel sounds heard. Central nervous system:  Alert and oriented. No focal neurological deficits. Extremities: Symmetric 5 x 5 power. Skin: No rashes, lesions or ulcers     Data Reviewed: I have personally reviewed following labs and imaging studies  CBC: Recent Labs  Lab 07/31/19 1540 08/01/19 0221  WBC 36.1* 26.8*  NEUTROABS 28.9*  --   HGB 18.3* 15.6  HCT 51.2 44.6  MCV 90.8 92.7  PLT 297 291    Basic Metabolic Panel: Recent Labs  Lab 07/31/19 1540 07/31/19 1818 08/01/19 0221  NA 130*  --  133*  K 3.1*  --  3.8  CL 90*  --  92*  CO2 23  --  27  GLUCOSE 162*  --  97  BUN 23*  --  14  CREATININE 1.14  --  1.11  CALCIUM 9.7  --  9.0  MG  --  2.0  --    GFR: Estimated Creatinine Clearance: 103.9 mL/min (by C-G formula based on SCr of 1.11 mg/dL). Liver Function Tests: No results for input(s): AST, ALT, ALKPHOS, BILITOT, PROT, ALBUMIN in the last 168 hours. No results for input(s): LIPASE, AMYLASE in the last 168 hours. No results for input(s): AMMONIA in the last 168 hours. Coagulation Profile: No results for input(s): INR, PROTIME in the last 168 hours. Cardiac Enzymes: No results for input(s): CKTOTAL, CKMB, CKMBINDEX, TROPONINI in the last 168 hours. BNP (last 3 results) No results for input(s): PROBNP in the last 8760 hours. HbA1C: Recent Labs    08/01/19 0221  HGBA1C 5.5   CBG: No results for input(s): GLUCAP in the last 168 hours. Lipid Profile: No results for input(s): CHOL, HDL, LDLCALC, TRIG, CHOLHDL, LDLDIRECT in the last 72 hours. Thyroid Function Tests: No results for input(s): TSH, T4TOTAL, FREET4, T3FREE, THYROIDAB in the last 72 hours. Anemia Panel: No results for input(s): VITAMINB12, FOLATE, FERRITIN, TIBC, IRON, RETICCTPCT in the last 72 hours.  Sepsis Labs: Recent Labs  Lab 07/31/19 1540 07/31/19 1809 08/01/19 0221  WBC 36.1*  --  26.8*  LATICACIDVEN 2.2* 1.2  --     Recent Results (from the past 240 hour(s))  Respiratory Panel by RT PCR (Flu A&B, Covid) - Nasopharyngeal Swab     Status: None   Collection Time: 07/31/19  5:56 PM   Specimen: Nasopharyngeal Swab  Result Value Ref Range Status   SARS Coronavirus 2 by RT PCR NEGATIVE NEGATIVE Final    Comment: (NOTE) SARS-CoV-2 target nucleic acids are NOT DETECTED. The SARS-CoV-2 RNA is generally detectable in upper respiratoy specimens during the acute phase of infection. The  lowest concentration of SARS-CoV-2 viral copies this assay can detect is 131 copies/mL. A negative result does not preclude SARS-Cov-2 infection and should not be used as the sole basis for treatment or other patient management decisions. A negative result may occur with  improper specimen collection/handling, submission of specimen other than nasopharyngeal swab, presence of viral mutation(s) within the areas targeted by this assay, and inadequate number of viral copies (<131 copies/mL). A negative result must be combined with clinical observations, patient history, and epidemiological information. The expected result is Negative. Fact Sheet for Patients:  https://www.moore.com/ Fact Sheet for Healthcare Providers:  https://www.young.biz/ This test is not yet ap proved or cleared by the [place] FDA and  has been authorized for detection and/or diagnosis of SARS-CoV-2 by FDA under an Emergency Use Authorization (EUA). This EUA will remain  in effect (meaning this test can be used) for the duration of the COVID-19 declaration under Section 564(b)(1)  of the Act, 21 U.S.C. section 360bbb-3(b)(1), unless the authorization is terminated or revoked sooner.    Influenza A by PCR NEGATIVE NEGATIVE Final   Influenza B by PCR NEGATIVE NEGATIVE Final    Comment: (NOTE) The Xpert Xpress SARS-CoV-2/FLU/RSV assay is intended as an aid in  the diagnosis of influenza from Nasopharyngeal swab specimens and  should not be used as a sole basis for treatment. Nasal washings and  aspirates are unacceptable for Xpert Xpress SARS-CoV-2/FLU/RSV  testing. Fact Sheet for Patients: PinkCheek.be Fact Sheet for Healthcare Providers: GravelBags.it This test is not yet approved or cleared by the [place] FDA and  has been authorized for detection and/or diagnosis of SARS-CoV-2 by  FDA under an Emergency  Use Authorization (EUA). This EUA will remain  in effect (meaning this test can be used) for the duration of the  Covid-19 declaration under Section 564(b)(1) of the Act, 21  U.S.C. section 360bbb-3(b)(1), unless the authorization is  terminated or revoked. Performed at [facility], [address], [place], [place] [address]   Culture, blood (routine x 2)     Status: None (Preliminary result)   Collection Time: 07/31/19  6:00 PM   Specimen: Right Antecubital; Blood  Result Value Ref Range Status   Specimen Description RIGHT ANTECUBITAL  Final   Special Requests   Final    BOTTLES DRAWN AEROBIC AND ANAEROBIC Blood Culture adequate volume   Culture   Final    NO GROWTH < 24 HOURS Performed at [facility], [address], [place], [place] [address]    Report Status PENDING  Incomplete  Culture, blood (routine x 2)     Status: None (Preliminary result)   Collection Time: 07/31/19  6:11 PM   Specimen: BLOOD RIGHT HAND  Result Value Ref Range Status   Specimen Description BLOOD RIGHT HAND  Final   Special Requests   Final    BOTTLES DRAWN AEROBIC AND ANAEROBIC Blood Culture adequate volume   Culture   Final    NO GROWTH < 24 HOURS Performed at [facility], [address] [place] [address]    Report Status PENDING  Incomplete         Radiology Studies: CT Soft Tissue Neck W Contrast  Result Date: 07/31/2019 CLINICAL DATA:  Throat pain, swelling in right side of jaw, difficult to speak and swallow, vomiting for 4 days. EXAM: CT NECK WITH CONTRAST TECHNIQUE: Multidetector CT imaging of the neck was performed using the standard protocol following the bolus administration of intravenous contrast. CONTRAST:  1mL OMNIPAQUE IOHEXOL 300 MG/ML  SOLN COMPARISON:  No pertinent prior studies available for comparison. FINDINGS: Motion degraded examination. Pharynx and larynx: There is prominent soft tissue swelling centered along the posterior right mandible at site of a carious  posterior right mandibular molar. There is subtle periapical lucency surrounding this carious posterior right mandibular molar. here is circumscribed hypodensity centered at this site along both the superficial and deep aspects of the mandible measuring 2.7 x 2.1 x 2.1 cm likely reflecting soft tissue abscess (series 2, image 44) (series 4, image 47). There is additional surrounding perimandibular phlegmon. Edema tracks into the right floor of mouth were there is associated myositis (series 4, image 37). Edema also extends more inferiorly within the neck. Edema also extends to the submucosal right pharyngeal and parapharyngeal regions. The oropharyngeal airway remains patent. The larynx is unremarkable. Salivary glands: Likely reactive edema and swelling of the right submandibular gland. The bilateral parotid and left  submandibular glands are unremarkable. Thyroid: No abnormality Lymph nodes: Upper cervical chain lymphadenopathy (greater on the right), likely reactive. Vascular: The major vascular structures of the neck appear patent. Limited intracranial: No abnormality identified. Visualized orbits: Visualized orbits demonstrate no acute abnormality. Mastoids and visualized paranasal sinuses: Moderate-sized right maxillary sinus mucous retention cyst. No significant mastoid effusion. Skeleton: No acute bony abnormality. Upper chest: No consolidation within the imaged lung apices. These results were called by telephone at the time of interpretation on 07/31/2019 at 5:44 pm to provider PA [facility] , who verbally acknowledged these results. IMPRESSION: Perimandibular, floor of mouth and upper neck soft tissue infection which appears centered along the right posterior mandible where there is a carious posterior right mandibular molar with periapical lucency. Circumscribed hypodensity along the superficial and deep aspects of the mandible at this site measuring 2.7 x 2.1 x 2.1 cm likely reflecting soft tissue  abscess. Edema not only tracks into the floor of mouth but also into the submucosal right pharyngeal and parapharyngeal regions. The oropharyngeal airway remains patent. Upper cervical lymphadenopathy, likely reactive. Electronically Signed   By: Jackey Loge DO   On: 07/31/2019 17:45        Scheduled Meds: . fentaNYL      . potassium chloride  40 mEq Oral BID   Continuous Infusions: . sodium chloride 100 mL/hr at 08/01/19 0054  . ampicillin-sulbactam (UNASYN) IV 3 g (08/01/19 0243)     LOS: 1 day    Time spent: 35 minutes    Jackie Plum, MD [facility] Pager [phone]  If 7PM-7AM, please contact night-coverage [email or web address] Password [facility] 08/01/2019, 12:39 PM

## 2019-08-01 NOTE — Progress Notes (Signed)
Subjective:    Jordan Ball is a healthy 34 y.o. male who is now POD 0 s/p incision and drainage of the right submandibular, sublingual, submental, and masticator spaces as well as extraction of tooth #32. He reports to be doing better and feeling better. His swelling has improved, but he does endorse pain with swallowing. He is tolerating PO intake, ambulating and has undergone drainage irrigation X 1 since surgery.     Objective:    BP (!) 133/95 (BP Location: Left Arm)   Pulse (!) 102   Temp 98.7 F (37.1 C) (Oral)   Resp 16   Ht 6' (1.829 m)   Wt 90.7 kg   SpO2 100%   BMI 27.12 kg/m   General:   alert, not in distress  HEENT:   Continued erythema of the right neck with induration appreciated to the inferior border of the mandible. Cellulitis of the soft tissue has improved since surgery and when compared to pre-operative markings. Drains are in place with serosanguinous drainage and no purulence appreciated. Intraorally, incisions are well approximated with sutures in place. MIO is 30cm.   CV:   Elevated rate, regular rhythm  Pulm  NWB   ABD:  Soft, Non-distended   Extremities  WWP     Assessment:   Jordan Ball is a healthy 34 y.o. male who is now POD 0 s/p incision and drainage of the right submandibular, sublingual, submental, and masticator spaces as well as extraction of tooth #32. He is progressing well since surgery with expected swelling and discomfort.     Plan:    1. Patient should maintain a soft diet until drains are removed 2. Irrigate Drains TID using betadine in NS- (I have instructed nursing staff on the floor how to perform this)  3. Please trend CRP and CBC daily 4. Assuming patient's clinical presentation and labs continue to improve I will likely remove one drain tomorrow and back one out half way with plans for removal of all drains by Tuesday.  5. Continue Unasyn while admitted, will likely transition to Augmentin at discharge pending cultures  from OR.  6. Please provide peridex mouth rinse twice daily while admitted and discharge with a 2 week supply.  7. Defer pain management to primary team, but normal post I&D/extractions require Norco 5/325 for 3-4 days.   Follow up after discharge: Once cleared for discharge, Patient should follow up in 1 week at my office below. Please provide contact information to patient and ask him to contact our office to schedule and appointment.   Dr. Valda Favia  [facility]  [phone]  [address] [place], [place] [address]

## 2019-08-01 NOTE — Anesthesia Preprocedure Evaluation (Addendum)
Anesthesia Evaluation  Patient identified by MRN, date of birth, ID band Patient awake    Reviewed: Allergy & Precautions, NPO status , Patient's Chart, lab work & pertinent test results  History of Anesthesia Complications Negative for: history of anesthetic complications  Airway Mallampati: IV  TM Distance: >3 FB Neck ROM: Full  Mouth opening: Limited Mouth Opening Comment: Submandibular soft tissue swelling, limited mouth opening Dental  (+) Poor Dentition, Dental Advisory Given   Pulmonary neg pulmonary ROS, neg recent URI,    breath sounds clear to auscultation       Cardiovascular negative cardio ROS   Rhythm:Regular     Neuro/Psych negative neurological ROS  negative psych ROS   GI/Hepatic negative GI ROS, Neg liver ROS,   Endo/Other  negative endocrine ROS  Renal/GU negative Renal ROS     Musculoskeletal negative musculoskeletal ROS (+)   Abdominal   Peds  Hematology negative hematology ROS (+)   Anesthesia Other Findings   Reproductive/Obstetrics                             Anesthesia Physical Anesthesia Plan  ASA: I  Anesthesia Plan: General   Post-op Pain Management:    Induction: Intravenous and Rapid sequence  PONV Risk Score and Plan: 2 and Ondansetron and Dexamethasone  Airway Management Planned: Oral ETT and Video Laryngoscope Planned  Additional Equipment: None  Intra-op Plan:   Post-operative Plan: Extubation in OR and Possible Post-op intubation/ventilation  Informed Consent: I have reviewed the patients History and Physical, chart, labs and discussed the procedure including the risks, benefits and alternatives for the proposed anesthesia with the patient or authorized representative who has indicated his/her understanding and acceptance.     Dental advisory given  Plan Discussed with: CRNA and Surgeon  Anesthesia Plan Comments:         Anesthesia Quick Evaluation

## 2019-08-01 NOTE — Anesthesia Postprocedure Evaluation (Signed)
Anesthesia Post Note  Patient: Jordan Ball  Procedure(s) Performed: INCISION AND DRAINAGE OF RIGHT SUBMANDIBULAR SPACE ABSCESS AND EXTRACTION OF TEETH (N/A )     Patient location during evaluation: PACU Anesthesia Type: General Level of consciousness: awake and alert Pain management: pain level controlled Vital Signs Assessment: post-procedure vital signs reviewed and stable Respiratory status: spontaneous breathing, nonlabored ventilation, respiratory function stable and patient connected to nasal cannula oxygen Cardiovascular status: blood pressure returned to baseline and stable Postop Assessment: no apparent nausea or vomiting Anesthetic complications: no    Last Vitals:  Vitals:   08/01/19 0959 08/01/19 1008  BP: 125/89 123/85  Pulse: (!) 105 (!) 101  Resp: 17   Temp: 36.9 C   SpO2: 93% 93%    Last Pain:  Vitals:   08/01/19 1008  TempSrc:   PainSc: Asleep                 Emrys Mckamie

## 2019-08-01 NOTE — Progress Notes (Signed)
Wound site in Right side of neck irrigated as directed by Dr. Julien Girt. Saline solution with betadine at [place] drain sites. Gauze dressing changed. Pt. Tolerated well. Will educate night shift on procedure. Repeat TID

## 2019-08-01 NOTE — Op Note (Signed)
Oral and Maxillofacial Surgery Operative Report   Date of Surgery: 08/01/2019 Surgeon: Fabian Sharp, MD, DDS  Preoperative Diagnosis: - Odontogenic infection involving right submandibular, sublingual, submental, and masticator space. - Carious #32  Postoperative Diagnosis: Same  Procedure Performed: CPT 74944, 96759, 16384, 66599: incision and drainage of the right submandibular, sublingual, submental, and masticator spaces CPT D7210 x32: Surgical extraction of tooth # 32  Anesthesia:  General   Drains: Penrose drains in the right submandibular space notched drain anterior) Penrose drains in the right buccal space (flat drain posterior)  Cultures: Purulent exudate from the abscess sent for aerobic/anaerobic, fungal, AFB,   Estimated Blood Loss: 50 mL  Urine Output: Not measured  Dressings: Fluffs and stretch net gauze to the neck.  Complications: None  Operative Findings: Purulence noted in the abscess Complete removal of associated teeth  Indications for Surgery:  Per records, Jordan Hedeen Gibsonis a 34 y.o.malewith a history of bad dentition presents with jaw swelling, nausea, vomiting that started Wednesday and has been progressing. Patient has been intolerant of food or liquids. No palliating or provoking factors. He has been trying to keep hydrated with liquids. He describes fevers, chills. He went to the urgent care earlier today and was given Augmentin. Because he still felt ill, he presented to the hospital for further evaluation. I was contacted by [facility] for discussion of CT findings of collection along the right posterior lingual mandible concerning for ludwig's angina. On admission WBC was 36.1, lactic acid was 2.2, COVID negative, Glucose elevated to 162 and blood cultures were drawn X2. This morning his WBC had reduced to 26.2  and his electrolytes had somewhat normalized but remains mildly hyponatremic and hypochloremic. (Hgb A1c was 5.5).  The patient was subsequently transferred to [facility] for management. The patient agrees with the above history. He reports that his mouth has become harder to open, but is unsure of a specific tooth that has been painful. He reports going to an urgent care earlier this week, but was told he had a salivary gland infection and was prescribed an antibiotics, but he isnt sure of the name. He reports not taking the antibiotic as it was to large to swallow. Later he experience nausea and noticed blood within it at which time he decided to go to the emergency department. Upon arrival he is protecting his airway well without stridor or tripoding appreciated. There was an obvious source of the infection (tooth #32) that would require surgical extraction with incision and drainage of the right neck.  Procedure: The patient was identified in the precare holding area and medical history and informed consent were reviewed with the patient and family. All questions were answered. The patient was taken via stretcher to operating room and placed in the supine position on the operating table. Standard lines and monitors were applied. The patient was preoxygenated and general anesthesia was induced via IV. The patient was intubated oroendotracheally without complications. Bilateral breath sounds and end tidal CO2 were noted. The arms were tucked.  Extremities were evaluated and found to be in normal range of motion with all pressure points padded. Preoperative antibiotics were administered. The patient was prepped and draped using sterile technique in a usual Oral and Maxillofacial Surgery manner. A throat pack was placed and noted. 10 mL of local anesthetic was injected. A second timeout was performed.   Attention was directed towards the right inferior border of the mandible, which was delineated with a marking pen. 10 mL of  local anesthetic was injected extraorally using 1% lidocaine with 1: 100 K epi just below depth of  the skin on the right submandibular site. Then, an 18-gauge needle on a 10cc syringe was inserted through the skin at at the most fluctuant portion of the abscess. Approximately  4 cc of purulent drainage was obtained, syringe was capped and sent for cultures. A small 2cm centimeter incision in the right neck was made through the cutaneous tissue parallell to and two fingerwidths below the inferior border of the mandible. Hemostat was then used to bluntly dissect through the platysma and subcutaneous tissues up to the inferior border of the mandible. A moderate amount ofa purulence was expressed. The hemostat was then used to explore the buccal and vestibular spaces along the posterior and anterior lateral body of the mandible. The same procedure was then performed on the medial surface of the right  mandible with exploration of the right sublingual, submandibular, and lateral pharyngeal spaces.   Attention was then brought intraorally. Soft tissues surrounding #32 was released with a periosteal elevator. Tooth #32 was luxated with straight elevators and removed with 150* forceps. The socket was curetted and all granulation tissue removed.  Moderate amount of purulence noted. The bone was smoothed with a bone file and the sites were irrigated with copious amounts of normal saline.   Next, the 60 mL syringe was used to irrigate the right buccal space, submandibular and lateral pharyngeal spaces until no purulence was returned. One 1/4  inch Penrose drain was then placed along the right lateral surface of the mandible and secured with a silk suture. Next, a 1/4 inch Penrose drain was placed along the medial surface of the mandible and advanced into the submandibular and lateral pharyngeal space and secured in place with silk suture. The drains were then irrigated with copious amounts of normal saline and bacitracin. The rest of the mouth was examined and the remaining teeth were grossly intact.   The oral cavity  and oropharynx were irrigated with copious amounts of normal saline and the oropharynx was suctioned. The throat pack was removed under direct visualization and the oropharynx was suctioned and an orogastric tube was placed and the stomach contents were removed. The orogastric tube was then removed. Prep and drapes were removed.Fluff gauze was placed around the drain and a stretch net was wrapped around the patient's head to hold it in place. The patient was cleansed and dried and turned back to the Anesthesia Care team where he was awakened without event and transferred to the PACU by the Anesthesia Care team for postoperative recovery.  Postoperative Care Plan: Patient to be transferred to floor once anesthesia discharge criteria have been met.

## 2019-08-01 NOTE — Anesthesia Procedure Notes (Signed)
Procedure Name: Intubation Date/Time: 08/01/2019 8:13 AM Performed by: Modena Morrow, CRNA Pre-anesthesia Checklist: Patient identified, Emergency Drugs available, Suction available and Patient being monitored Patient Re-evaluated:Patient Re-evaluated prior to induction Oxygen Delivery Method: Circle system utilized Preoxygenation: Pre-oxygenation with 100% oxygen Induction Type: IV induction Ventilation: Mask ventilation without difficulty Grade View: Grade I Tube type: Oral Tube size: 7.5 mm Number of attempts: 1 Airway Equipment and Method: Stylet and Oral airway Placement Confirmation: ETT inserted through vocal cords under direct vision,  positive ETCO2 and breath sounds checked- equal and bilateral Secured at: 23 cm Tube secured with: Tape Dental Injury: Teeth and Oropharynx as per pre-operative assessment

## 2019-08-01 NOTE — Brief Op Note (Signed)
07/31/2019 - 08/01/2019  9:30 AM     PATIENT:  Jordan Ball  34 y.o. male  PRE-OPERATIVE DIAGNOSIS:  submandibular space abscess  POST-OPERATIVE DIAGNOSIS:  submandibular space abscess  PROCEDURE:  Procedure(s): INCISION AND DRAINAGE OF RIGHT SUBMANDIBULAR SPACE ABSCESS AND EXTRACTION OF TEETH (N/A)  SURGEON:  Surgeon(s) and Role:    * Lovena Neighbours, MD - Primary   ANESTHESIA:   general  EBL:  50 mL   BLOOD ADMINISTERED:none  DRAINS: 1/4" Penrose drain in the right submandibular and right buccal space   LOCAL MEDICATIONS USED:  LIDOCAINE   SPECIMEN:  Aspirate  DISPOSITION OF SPECIMEN:  Micro  COUNTS:  YES  TOURNIQUET:  None  DICTATION: .Dragon Dictation  PLAN OF CARE: Admit to inpatient   PATIENT DISPOSITION:  PACU - hemodynamically stable.   Delay start of Pharmacological VTE agent (>24hrs) due to surgical blood loss or risk of bleeding: no

## 2019-08-02 ENCOUNTER — Encounter: Payer: Self-pay | Admitting: *Deleted

## 2019-08-02 DIAGNOSIS — K122 Cellulitis and abscess of mouth: Secondary | ICD-10-CM

## 2019-08-02 DIAGNOSIS — R739 Hyperglycemia, unspecified: Secondary | ICD-10-CM

## 2019-08-02 DIAGNOSIS — R030 Elevated blood-pressure reading, without diagnosis of hypertension: Secondary | ICD-10-CM

## 2019-08-02 DIAGNOSIS — E871 Hypo-osmolality and hyponatremia: Secondary | ICD-10-CM

## 2019-08-02 DIAGNOSIS — E876 Hypokalemia: Secondary | ICD-10-CM

## 2019-08-02 LAB — COMPREHENSIVE METABOLIC PANEL
ALT: 50 U/L — ABNORMAL HIGH (ref 0–44)
AST: 32 U/L (ref 15–41)
Albumin: 2.3 g/dL — ABNORMAL LOW (ref 3.5–5.0)
Alkaline Phosphatase: 105 U/L (ref 38–126)
Anion gap: 9 (ref 5–15)
BUN: 13 mg/dL (ref 6–20)
CO2: 27 mmol/L (ref 22–32)
Calcium: 8.8 mg/dL — ABNORMAL LOW (ref 8.9–10.3)
Chloride: 103 mmol/L (ref 98–111)
Creatinine, Ser: 0.94 mg/dL (ref 0.61–1.24)
GFR calc Af Amer: >60 mL/min (ref >60)
GFR calc non Af Amer: >60 mL/min (ref >60)
Glucose, Bld: 119 mg/dL — ABNORMAL HIGH (ref 70–99)
Potassium: 3.5 mmol/L (ref 3.5–5.1)
Sodium: 139 mmol/L (ref 135–145)
Total Bilirubin: 0.6 mg/dL (ref 0.3–1.2)
Total Protein: 6 g/dL — ABNORMAL LOW (ref 6.5–8.1)

## 2019-08-02 LAB — GLUCOSE, CAPILLARY
Glucose-Capillary: 132 mg/dL — ABNORMAL HIGH (ref 70–99)
Glucose-Capillary: 100 mg/dL — ABNORMAL HIGH (ref 70–99)
Glucose-Capillary: 123 mg/dL — ABNORMAL HIGH (ref 70–99)
Glucose-Capillary: 110 mg/dL — ABNORMAL HIGH (ref 70–99)

## 2019-08-02 LAB — CBC
HCT: 38.3 % — ABNORMAL LOW (ref 39.0–52.0)
Hemoglobin: 13.3 g/dL (ref 13.0–17.0)
MCH: 32.4 pg (ref 26.0–34.0)
MCHC: 34.7 g/dL (ref 30.0–36.0)
MCV: 93.2 fL (ref 80.0–100.0)
Platelets: 289 10*3/uL (ref 150–400)
RBC: 4.11 MIL/uL — ABNORMAL LOW (ref 4.22–5.81)
RDW: 12.6 % (ref 11.5–15.5)
WBC: 26.4 10*3/uL — ABNORMAL HIGH (ref 4.0–10.5)
nRBC: 0 % (ref 0.0–0.2)

## 2019-08-02 LAB — C-REACTIVE PROTEIN: CRP: 34.2 mg/dL — ABNORMAL HIGH (ref <1.0)

## 2019-08-02 LAB — MRSA PCR SCREENING: MRSA by PCR: NEGATIVE

## 2019-08-02 MED ORDER — ENOXAPARIN SODIUM 40 MG/0.4ML ~~LOC~~ SOLN
40.0000 mg | SUBCUTANEOUS | Status: DC
  Administered 2019-08-02 – 2019-08-03 (×2): 40 mg via SUBCUTANEOUS
  Filled 2019-08-02: qty 0.4

## 2019-08-02 MED ORDER — CHLORHEXIDINE GLUCONATE 0.12 % MT SOLN
15.0000 mL | Freq: Two times a day (BID) | OROMUCOSAL | Status: DC
  Administered 2019-08-02 – 2019-08-04 (×5): 15 mL via OROMUCOSAL
  Filled 2019-08-02 (×5): qty 15

## 2019-08-02 NOTE — Progress Notes (Signed)
Subjective:    Jordan Ball is a healthy 34 y.o. male who is now POD 1 s/p incision and drainage of the right submandibular, sublingual, submental, and masticator spaces as well as extraction of tooth #32. Overnight he has done well with improved pain an swelling. His swallowing has subjectively improved slightly. He reports ambulating and having his drains irrigated this morning X 1.     Objective:    BP 124/81 (BP Location: Right Arm)   Pulse 79   Temp 98.3 F (36.8 C)   Resp 19   Ht 6' (1.829 m)   Wt 90.7 kg   SpO2 99%   BMI 27.12 kg/m   General:   alert, not in distress  HEENT:   Continued erythema of the right neck with induration appreciated to the inferior border of the mandible but improved when compared to yesterday. Cellulitis of the soft tissue has improved since surgery and when compared to pre-operative markings. Drains are in place with purulent drainage. Intraorally, incisions are well approximated with sutures in place. MIO is 30cm.   CV:   RRR  Pulm  NWB   ABD:  Soft, Non-distended   Extremities  WWP     Assessment:   Jordan Ball is a healthy 35 y.o. male who is now POD 1 s/p incision and drainage of the right submandibular, sublingual, submental, and masticator spaces as well as extraction of tooth #32. He is progressing well since surgery with expected swelling and discomfort. CRP and CBC have decreased marginally, but I expect a greater decrease tomorrow morning. Give purulent drainage of his drains, I will plan to leave drains in place with hopeful removal of buccal drain and backing out lateral pharyngeal drain half way on Tuesday.     Plan:    1. Patient should maintain a soft diet until drains are removed 2. Irrigate Drains TID using betadine in NS- (I have instructed nursing staff on the floor how to perform this)  3. Please trend CRP and CBC daily 4. Assuming patient's clinical presentation and labs continue to improve I will likely remove one  drain tomorrow and back one out half way with plans for removal of all drains by Wednesday.  5. Continue Unasyn while admitted, will likely transition to Augmentin at discharge pending cultures from OR.  6. Please provide peridex mouth rinse twice daily while admitted and discharge with a 2 week supply.  7. Defer pain management to primary team, but normal post I&D/extractions require Norco 5/325 for 3-4 days. 8. OK for DVT prophylaxis from a bleeding perspective, Moderate risk of DVT following surgery in the setting of elevated inflammation and being in the immediate postoperative period with limited ambulation.    Follow up after discharge: Once cleared for discharge, Patient should follow up in 1 week at my office below. Please provide contact information to patient and ask him to contact our office to schedule and appointment.   Dr. Valda Favia  [facility]  [phone]  [address] [place], [place] [address]

## 2019-08-02 NOTE — TOC Initial Note (Signed)
Transition of Care [facility]) - Initial/Assessment Note    Patient Details  Name: Jordan Ball MRN: [id number] Date of Birth: 02-19-1986  [facility] CM/SW Contact:    Kermit Balo, RN Phone Number: 08/02/2019, 3:01 PM  Clinical Narrative:                 Pt is from home alone but says family could check in on him. He has a PCP but no insurance. He wants to stick with PCP he currently has.  Pt denies issues with transportation.  He may need medication assistance at d/c.  TOC following.  Expected Discharge Plan: Home/Self Care Barriers to Discharge: Continued Medical Work up   Patient Goals and CMS Choice        Expected Discharge Plan and Services Expected Discharge Plan: Home/Self Care       Living arrangements for the past 2 months: Apartment                                      Prior Living Arrangements/Services Living arrangements for the past 2 months: Apartment Lives with:: Self Patient language and need for interpreter reviewed:: Yes Do you feel safe going back to the place where you live?: Yes        Care giver support system in place?: (Family can check in on him)   Criminal Activity/Legal Involvement Pertinent to Current Situation/Hospitalization: No - Comment as needed  Activities of Daily Living Home Assistive Devices/Equipment: None ADL Screening (condition at time of admission) Patient's cognitive ability adequate to safely complete daily activities?: Yes Is the patient deaf or have difficulty hearing?: No Does the patient have difficulty seeing, even when wearing glasses/contacts?: No Does the patient have difficulty concentrating, remembering, or making decisions?: No Patient able to express need for assistance with ADLs?: Yes Does the patient have difficulty dressing or bathing?: No Independently performs ADLs?: Yes (appropriate for developmental age) Does the patient have difficulty walking or climbing stairs?:  No Weakness of Legs: None Weakness of Arms/Hands: None  Permission Sought/Granted                  Emotional Assessment Appearance:: Appears stated age Attitude/Demeanor/Rapport: Engaged Affect (typically observed): Accepting Orientation: : Oriented to Self, Oriented to Place, Oriented to  Time, Oriented to Situation   Psych Involvement: No (comment)  Admission diagnosis:  Ludwig's angina [K12.2] Patient Active Problem List   Diagnosis Date Noted  . 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 Elevated BP without diagnosis of hypertension 07/31/2019  . Hyperglycemia 07/31/2019  . Sepsis (HCC) 07/31/2019  . Hypokalemia 07/31/2019  . Hyponatremia 07/31/2019  . Joint contracture of the lower leg 01/08/2011  . ACL injury tear 12/17/2010  . Acute medial meniscus tear of left knee 10/23/2010   PCP:  [facility] Pharmacy:   [facility] [address], [place] [address] [place] [address] [place] [address] [place] [address] Phone: [phone] Fax: [phone]     Social Determinants of Health (SDOH) Interventions    Readmission Risk Interventions No flowsheet data found.

## 2019-08-02 NOTE — Progress Notes (Addendum)
PROGRESS NOTE    Jordan Ball  NLG:[id number]  DOB: Sep 19, 1985  PCP: [facility] Admit date:07/31/2019  34 year old male with history of poor dentition presents with 4-day history of progressively worsening jaw swelling associated with fever, chills and difficulty maintaining oral intake.  He also reports vomiting.  Patient noted to be tachycardic, tachypneic with leukocytosis and lactic acidosis 2.2 in the ED.  Facial CT revealed Ludwick's angina with abscess. Hospital course: Patient admitted to [facility] for further evaluation and management with oral surgery consultation.  Incision and drainage of the rightsubmandibular, sublingual, submental, and masticator spaces as well as extraction of tooth #32 on 08/01/2019. He still has drains in place and oral surgery following along. Subjective:   He is progressing well since surgery with expected swelling and discomfort.  States can eat somewhat but cannot chew hard.  Per Dr. Julien Girt, still has pustular drainage and plan to keep drain in place with irrigation for another day.    Objective: Vitals:   08/02/19 0400 08/02/19 0851 08/02/19 1358 08/02/19 1707  BP: 125/85 124/81 120/79 117/78  Pulse: 62 79 69 67  Resp:  19  20  Temp: 98.6 F (37 C) 98.3 F (36.8 C) 98.4 F (36.9 C) 98.3 F (36.8 C)  TempSrc: Oral  Oral   SpO2: 97% 99% 97% 94%  Weight:      Height:        Intake/Output Summary (Last 24 hours) at 08/02/2019 1844 Last data filed at 08/01/2019 2000 Gross per 24 hour  Intake 240 ml  Output --  Net 240 ml   Filed Weights   07/31/19 1458  Weight: 90.7 kg    Physical Examination:  General exam: Appears in no acute distress, dressing along right mandibular area Respiratory system: Clear to auscultation. Respiratory effort normal. Cardiovascular system: S1 & S2 heard, RRR. No JVD, murmurs, rubs, gallops or clicks. No pedal edema. Gastrointestinal system: Abdomen is nondistended, soft and nontender.  Normal bowel sounds heard. Central nervous system: Alert and oriented. No new focal neurological deficits. Extremities: No contractures, edema or joint deformities.  Skin: No rashes, lesions or ulcers Psychiatry: Judgement and insight appear normal. Mood & affect appropriate.   Data Reviewed: I have personally reviewed following labs and imaging studies  CBC: Recent Labs  Lab 07/31/19 1540 08/01/19 0221 08/02/19 0241  WBC 36.1* 26.8* 26.4*  NEUTROABS 28.9*  --   --   HGB 18.3* 15.6 13.3  HCT 51.2 44.6 38.3*  MCV 90.8 92.7 93.2  PLT 297 291 289   Basic Metabolic Panel: Recent Labs  Lab 07/31/19 1540 07/31/19 1818 08/01/19 0221 08/02/19 0241  NA 130*  --  133* 139  K 3.1*  --  3.8 3.5  CL 90*  --  92* 103  CO2 23  --  27 27  GLUCOSE 162*  --  97 119*  BUN 23*  --  14 13  CREATININE 1.14  --  1.11 0.94  CALCIUM 9.7  --  9.0 8.8*  MG  --  2.0  --   --    GFR: Estimated Creatinine Clearance: 122.7 mL/min (by C-G formula based on SCr of 0.94 mg/dL). Liver Function Tests: Recent Labs  Lab 08/02/19 0241  AST 32  ALT 50*  ALKPHOS 105  BILITOT 0.6  PROT 6.0*  ALBUMIN 2.3*   No results for input(s): LIPASE, AMYLASE in the last 168 hours. No results for input(s): AMMONIA in the last 168 hours. Coagulation Profile: No results for  input(s): INR, PROTIME in the last 168 hours. Cardiac Enzymes: No results for input(s): CKTOTAL, CKMB, CKMBINDEX, TROPONINI in the last 168 hours. BNP (last 3 results) No results for input(s): PROBNP in the last 8760 hours. HbA1C: Recent Labs    08/01/19 0221  HGBA1C 5.5   CBG: Recent Labs  Lab 08/02/19 0726 08/02/19 1308 08/02/19 1613  GLUCAP 132* 100* 123*   Lipid Profile: No results for input(s): CHOL, HDL, LDLCALC, TRIG, CHOLHDL, LDLDIRECT in the last 72 hours. Thyroid Function Tests: No results for input(s): TSH, T4TOTAL, FREET4, T3FREE, THYROIDAB in the last 72 hours. Anemia Panel: No results for input(s): VITAMINB12,  FOLATE, FERRITIN, TIBC, IRON, RETICCTPCT in the last 72 hours. Sepsis Labs: Recent Labs  Lab 07/31/19 1540 07/31/19 1809  LATICACIDVEN 2.2* 1.2    Recent Results (from the past 240 hour(s))  Respiratory Panel by RT PCR (Flu A&B, Covid) - Nasopharyngeal Swab     Status: None   Collection Time: 07/31/19  5:56 PM   Specimen: Nasopharyngeal Swab  Result Value Ref Range Status   SARS Coronavirus 2 by RT PCR NEGATIVE NEGATIVE Final    Comment: (NOTE) SARS-CoV-2 target nucleic acids are NOT DETECTED. The SARS-CoV-2 RNA is generally detectable in upper respiratoy specimens during the acute phase of infection. The lowest concentration of SARS-CoV-2 viral copies this assay can detect is 131 copies/mL. A negative result does not preclude SARS-Cov-2 infection and should not be used as the sole basis for treatment or other patient management decisions. A negative result may occur with  improper specimen collection/handling, submission of specimen other than nasopharyngeal swab, presence of viral mutation(s) within the areas targeted by this assay, and inadequate number of viral copies (<131 copies/mL). A negative result must be combined with clinical observations, patient history, and epidemiological information. The expected result is Negative. Fact Sheet for Patients:  https://www.moore.com/ Fact Sheet for Healthcare Providers:  https://www.young.biz/ This test is not yet ap proved or cleared by the [place] FDA and  has been authorized for detection and/or diagnosis of SARS-CoV-2 by FDA under an Emergency Use Authorization (EUA). This EUA will remain  in effect (meaning this test can be used) for the duration of the COVID-19 declaration under Section 564(b)(1) of the Act, 21 U.S.C. section 360bbb-3(b)(1), unless the authorization is terminated or revoked sooner.    Influenza A by PCR NEGATIVE NEGATIVE Final   Influenza B by PCR NEGATIVE  NEGATIVE Final    Comment: (NOTE) The Xpert Xpress SARS-CoV-2/FLU/RSV assay is intended as an aid in  the diagnosis of influenza from Nasopharyngeal swab specimens and  should not be used as a sole basis for treatment. Nasal washings and  aspirates are unacceptable for Xpert Xpress SARS-CoV-2/FLU/RSV  testing. Fact Sheet for Patients: https://www.moore.com/ Fact Sheet for Healthcare Providers: https://www.young.biz/ This test is not yet approved or cleared by the [place] FDA and  has been authorized for detection and/or diagnosis of SARS-CoV-2 by  FDA under an Emergency Use Authorization (EUA). This EUA will remain  in effect (meaning this test can be used) for the duration of the  Covid-19 declaration under Section 564(b)(1) of the Act, 21  U.S.C. section 360bbb-3(b)(1), unless the authorization is  terminated or revoked. Performed at [facility], [address], [place], [place] [address]   Culture, blood (routine x 2)     Status: None (Preliminary result)   Collection Time: 07/31/19  6:00 PM   Specimen: Right Antecubital; Blood  Result Value Ref Range Status   Specimen  Description RIGHT ANTECUBITAL  Final   Special Requests   Final    BOTTLES DRAWN AEROBIC AND ANAEROBIC Blood Culture adequate volume   Culture   Final    NO GROWTH 2 DAYS Performed at [facility], [address], [place], [place] [address]    Report Status PENDING  Incomplete  Culture, blood (routine x 2)     Status: None (Preliminary result)   Collection Time: 07/31/19  6:11 PM   Specimen: BLOOD RIGHT HAND  Result Value Ref Range Status   Specimen Description BLOOD RIGHT HAND  Final   Special Requests   Final    BOTTLES DRAWN AEROBIC AND ANAEROBIC Blood Culture adequate volume   Culture   Final    NO GROWTH 2 DAYS Performed at [facility], [address], [place], [place] [address]    Report Status PENDING  Incomplete  Aerobic/Anaerobic Culture (surgical/deep  wound)     Status: None (Preliminary result)   Collection Time: 08/01/19  8:25 AM   Specimen: PATH ENT excision; Tissue  Result Value Ref Range Status   Specimen Description TISSUE ABSCESS NECK  Final   Special Requests NONE  Final   Gram Stain   Final    MODERATE WBC PRESENT, PREDOMINANTLY PMN FEW GRAM POSITIVE COCCI IN CLUSTERS IN CHAINS RARE GRAM POSITIVE RODS Performed at [facility], [address] [place], [place] [address]    Culture PENDING  Incomplete   Report Status PENDING  Incomplete  MRSA PCR Screening     Status: None   Collection Time: 08/02/19 12:26 PM   Specimen: Nasal Mucosa; Nasopharyngeal  Result Value Ref Range Status   MRSA by PCR NEGATIVE NEGATIVE Final    Comment:        The GeneXpert MRSA Assay (FDA approved for NASAL specimens only), is one component of a comprehensive MRSA colonization surveillance program. It is not intended to diagnose MRSA infection nor to guide or monitor treatment for MRSA infections. Performed at [facility], [address], [place], [place] [address]       Radiology Studies: No results found.      Scheduled Meds: . chlorhexidine  15 mL Mouth/Throat BID  . enoxaparin (LOVENOX) injection  40 mg Subcutaneous Q24H  . potassium chloride  40 mEq Oral BID   Continuous Infusions: . ampicillin-sulbactam (UNASYN) IV 3 g (08/02/19 1354)    Assessment & Plan:   Right mandibular/dental abscess with Ludwick's angina, sepsis: Present on admission.  S/p incision and drainage by orofacial surgery.  Continue IV antibiotics for now.  Discussed with Dr. Vick Frees to remove 1 of 2 drains in a.m. if drainage subsides.  Still having pustular drainage.  CRP 39-->34.  WBC still elevated at 26K (was 30 6K on presentation).  Lactate down trended to 1.2.  Continue irrigation of drains..  Hopefully both the drains can be removed by Wednesday and discharged on oral antibiotics.  Continue soft diet for now and pain  medications available as needed.  Peridex mouth rinse twice daily-will need 2-week supply upon discharge.  Hyperglycemia: Likely stress-induced in the setting of acute infection.  Hemoglobin A1c 5.5 and no history of diabetes mellitus.  Secondary hypertension: Elevated BP likely in the setting of problem #1 and pain.  BP normalized now, monitor.  Hyponatremia/hypokalemia: Resolved with IV fluids and potassium replacement..   DVT prophylaxis: Added Lovenox as okay per surgery. Code Status: Full code Family / Patient Communication: Discussed with patient and Dr. Ane Payment Disposition Plan:  Home on Wednesday or Thursday based on clinical progress when drains removed.     LOS: 2 days    Time spent:     Guilford Shi, MD [facility] Pager in [place]  If 7PM-7AM, please contact night-coverage [email or web address] Password [facility] 08/02/2019, 6:44 PM

## 2019-08-03 LAB — GLUCOSE, CAPILLARY
Glucose-Capillary: 106 mg/dL — ABNORMAL HIGH (ref 70–99)
Glucose-Capillary: 97 mg/dL (ref 70–99)

## 2019-08-03 LAB — CBC
HCT: 40.7 % (ref 39.0–52.0)
Hemoglobin: 14 g/dL (ref 13.0–17.0)
MCH: 32.4 pg (ref 26.0–34.0)
MCHC: 34.4 g/dL (ref 30.0–36.0)
MCV: 94.2 fL (ref 80.0–100.0)
Platelets: 378 10*3/uL (ref 150–400)
RBC: 4.32 MIL/uL (ref 4.22–5.81)
RDW: 13.1 % (ref 11.5–15.5)
WBC: 15.9 10*3/uL — ABNORMAL HIGH (ref 4.0–10.5)
nRBC: 0 % (ref 0.0–0.2)

## 2019-08-03 LAB — C-REACTIVE PROTEIN: CRP: 15.9 mg/dL — ABNORMAL HIGH (ref <1.0)

## 2019-08-03 LAB — ACID FAST SMEAR (AFB, MYCOBACTERIA): Acid Fast Smear: NEGATIVE

## 2019-08-03 NOTE — Progress Notes (Signed)
PROGRESS NOTE    Jordan Ball  PJK:[id number]  DOB: 1986/04/07  PCP: [facility] Admit date:07/31/2019  34 year old male with history of poor dentition presents with 4-day history of progressively worsening jaw swelling associated with fever, chills and difficulty maintaining oral intake.  He also reports vomiting.  Patient noted to be tachycardic, tachypneic with leukocytosis and lactic acidosis 2.2 in the ED.  Facial CT revealed Ludwick's angina with abscess. Hospital course: Patient admitted to [facility] for further evaluation and management with oral surgery consultation.  Incision and drainage of the rightsubmandibular, sublingual, submental, and masticator spaces as well as extraction of tooth #32 on 08/01/2019. He still has drains in place and oral surgery following along. Subjective:   Patient resting comfortably.  Reports improvement in ability to open his mouth.  States he did not have breakfast as he did not get a tray.   Objective: Vitals:   08/02/19 1358 08/02/19 1707 08/02/19 2000 08/03/19 0853  BP: 120/79 117/78 (!) 141/80 132/81  Pulse: 69 67  60  Resp:  20  17  Temp: 98.4 F (36.9 C) 98.3 F (36.8 C)  98.9 F (37.2 C)  TempSrc: Oral  Oral   SpO2: 97% 94% 96% 95%  Weight:      Height:       No intake or output data in the 24 hours ending 08/03/19 1349 Filed Weights   07/31/19 1458  Weight: 90.7 kg    Physical Examination:  General exam: Appears in no acute distress, dressing along right mandibular area Respiratory system: Clear to auscultation. Respiratory effort normal. Cardiovascular system: S1 & S2 heard, RRR. No JVD, murmurs, rubs, gallops or clicks. No pedal edema. Gastrointestinal system: Abdomen is nondistended, soft and nontender. Normal bowel sounds heard. Central nervous system: Alert and oriented. No new focal neurological deficits. Extremities: No contractures, edema or joint deformities.  Skin: No rashes, lesions or  ulcers Psychiatry: Judgement and insight appear normal. Mood & affect appropriate.   Data Reviewed: I have personally reviewed following labs and imaging studies  CBC: Recent Labs  Lab 07/31/19 1540 08/01/19 0221 08/02/19 0241 08/03/19 0809  WBC 36.1* 26.8* 26.4* 15.9*  NEUTROABS 28.9*  --   --   --   HGB 18.3* 15.6 13.3 14.0  HCT 51.2 44.6 38.3* 40.7  MCV 90.8 92.7 93.2 94.2  PLT 297 291 289 378   Basic Metabolic Panel: Recent Labs  Lab 07/31/19 1540 07/31/19 1818 08/01/19 0221 08/02/19 0241  NA 130*  --  133* 139  K 3.1*  --  3.8 3.5  CL 90*  --  92* 103  CO2 23  --  27 27  GLUCOSE 162*  --  97 119*  BUN 23*  --  14 13  CREATININE 1.14  --  1.11 0.94  CALCIUM 9.7  --  9.0 8.8*  MG  --  2.0  --   --    GFR: Estimated Creatinine Clearance: 122.7 mL/min (by C-G formula based on SCr of 0.94 mg/dL). Liver Function Tests: Recent Labs  Lab 08/02/19 0241  AST 32  ALT 50*  ALKPHOS 105  BILITOT 0.6  PROT 6.0*  ALBUMIN 2.3*   No results for input(s): LIPASE, AMYLASE in the last 168 hours. No results for input(s): AMMONIA in the last 168 hours. Coagulation Profile: No results for input(s): INR, PROTIME in the last 168 hours. Cardiac Enzymes: No results for input(s): CKTOTAL, CKMB, CKMBINDEX, TROPONINI in the last 168 hours. BNP (last 3 results)  No results for input(s): PROBNP in the last 8760 hours. HbA1C: Recent Labs    08/01/19 0221  HGBA1C 5.5   CBG: Recent Labs  Lab 08/02/19 0726 08/02/19 1308 08/02/19 1613 08/02/19 2042 08/03/19 1035  GLUCAP 132* 100* 123* 110* 106*   Lipid Profile: No results for input(s): CHOL, HDL, LDLCALC, TRIG, CHOLHDL, LDLDIRECT in the last 72 hours. Thyroid Function Tests: No results for input(s): TSH, T4TOTAL, FREET4, T3FREE, THYROIDAB in the last 72 hours. Anemia Panel: No results for input(s): VITAMINB12, FOLATE, FERRITIN, TIBC, IRON, RETICCTPCT in the last 72 hours. Sepsis Labs: Recent Labs  Lab 07/31/19 1540  07/31/19 1809  LATICACIDVEN 2.2* 1.2    Recent Results (from the past 240 hour(s))  Respiratory Panel by RT PCR (Flu A&B, Covid) - Nasopharyngeal Swab     Status: None   Collection Time: 07/31/19  5:56 PM   Specimen: Nasopharyngeal Swab  Result Value Ref Range Status   SARS Coronavirus 2 by RT PCR NEGATIVE NEGATIVE Final    Comment: (NOTE) SARS-CoV-2 target nucleic acids are NOT DETECTED. The SARS-CoV-2 RNA is generally detectable in upper respiratoy specimens during the acute phase of infection. The lowest concentration of SARS-CoV-2 viral copies this assay can detect is 131 copies/mL. A negative result does not preclude SARS-Cov-2 infection and should not be used as the sole basis for treatment or other patient management decisions. A negative result may occur with  improper specimen collection/handling, submission of specimen other than nasopharyngeal swab, presence of viral mutation(s) within the areas targeted by this assay, and inadequate number of viral copies (<131 copies/mL). A negative result must be combined with clinical observations, patient history, and epidemiological information. The expected result is Negative. Fact Sheet for Patients:  https://www.moore.com/ Fact Sheet for Healthcare Providers:  https://www.young.biz/ This test is not yet ap proved or cleared by the [place] FDA and  has been authorized for detection and/or diagnosis of SARS-CoV-2 by FDA under an Emergency Use Authorization (EUA). This EUA will remain  in effect (meaning this test can be used) for the duration of the COVID-19 declaration under Section 564(b)(1) of the Act, 21 U.S.C. section 360bbb-3(b)(1), unless the authorization is terminated or revoked sooner.    Influenza A by PCR NEGATIVE NEGATIVE Final   Influenza B by PCR NEGATIVE NEGATIVE Final    Comment: (NOTE) The Xpert Xpress SARS-CoV-2/FLU/RSV assay is intended as an aid in  the  diagnosis of influenza from Nasopharyngeal swab specimens and  should not be used as a sole basis for treatment. Nasal washings and  aspirates are unacceptable for Xpert Xpress SARS-CoV-2/FLU/RSV  testing. Fact Sheet for Patients: https://www.moore.com/ Fact Sheet for Healthcare Providers: https://www.young.biz/ This test is not yet approved or cleared by the [place] FDA and  has been authorized for detection and/or diagnosis of SARS-CoV-2 by  FDA under an Emergency Use Authorization (EUA). This EUA will remain  in effect (meaning this test can be used) for the duration of the  Covid-19 declaration under Section 564(b)(1) of the Act, 21  U.S.C. section 360bbb-3(b)(1), unless the authorization is  terminated or revoked. Performed at [facility], [address], [place], [place] [address]   Culture, blood (routine x 2)     Status: None (Preliminary result)   Collection Time: 07/31/19  6:00 PM   Specimen: Right Antecubital; Blood  Result Value Ref Range Status   Specimen Description RIGHT ANTECUBITAL  Final   Special Requests   Final    BOTTLES DRAWN AEROBIC AND ANAEROBIC Blood  Culture adequate volume   Culture   Final    NO GROWTH 3 DAYS Performed at [facility], [address], [place], [place] [address]    Report Status PENDING  Incomplete  Culture, blood (routine x 2)     Status: None (Preliminary result)   Collection Time: 07/31/19  6:11 PM   Specimen: BLOOD RIGHT HAND  Result Value Ref Range Status   Specimen Description BLOOD RIGHT HAND  Final   Special Requests   Final    BOTTLES DRAWN AEROBIC AND ANAEROBIC Blood Culture adequate volume   Culture   Final    NO GROWTH 3 DAYS Performed at [facility], [address], [place], [place] [address]    Report Status PENDING  Incomplete  Aerobic/Anaerobic Culture (surgical/deep wound)     Status: None (Preliminary result)   Collection Time: 08/01/19  8:25 AM   Specimen: PATH ENT  excision; Tissue  Result Value Ref Range Status   Specimen Description TISSUE ABSCESS NECK  Final   Special Requests NONE  Final   Gram Stain   Final    MODERATE WBC PRESENT, PREDOMINANTLY PMN FEW GRAM POSITIVE COCCI IN CLUSTERS IN CHAINS RARE GRAM POSITIVE RODS    Culture   Final    RARE STAPHYLOCOCCUS EPIDERMIDIS CULTURE REINCUBATED FOR BETTER GROWTH Performed at [facility], [address] [place], [place] [address]    Report Status PENDING  Incomplete  Acid Fast Smear (AFB)     Status: None   Collection Time: 08/01/19  8:25 AM   Specimen: PATH ENT excision; Tissue  Result Value Ref Range Status   AFB Specimen Processing Concentration  Final   Acid Fast Smear Negative  Final    Comment: (NOTE) Performed At: [facility] [address] [place], [place] [id number] Jolene Schimke MD IH:[id number]    Source (AFB) TISSUE  Final    Comment: ABSCESS NECK Performed at [facility], [address], [place], [place] [address]   MRSA PCR Screening     Status: None   Collection Time: 08/02/19 12:26 PM   Specimen: Nasal Mucosa; Nasopharyngeal  Result Value Ref Range Status   MRSA by PCR NEGATIVE NEGATIVE Final    Comment:        The GeneXpert MRSA Assay (FDA approved for NASAL specimens only), is one component of a comprehensive MRSA colonization surveillance program. It is not intended to diagnose MRSA infection nor to guide or monitor treatment for MRSA infections. Performed at [facility], [address], [place], [place] [address]       Radiology Studies: No results found.      Scheduled Meds: . chlorhexidine  15 mL Mouth/Throat BID  . enoxaparin (LOVENOX) injection  40 mg Subcutaneous Q24H  . potassium chloride  40 mEq Oral BID   Continuous Infusions: . ampicillin-sulbactam (UNASYN) IV 3 g (08/03/19 1215)    Assessment & Plan:   Right mandibular/dental abscess with Ludwick's angina, sepsis: Present on admission.  S/p  incision and drainage by orofacial surgery.  Continue IV antibiotics for now.  Discussed with Dr. Vick Frees to remove 1 of 2 drains in a.m. if drainage subsides.  Still having pustular drainage.  CRP 39-->34.  WBC now downtrending  36K on presentation-->26K->.  15 K now.  Lactate down trended to 1.2.  Continue irrigation of drains.Per my discussion with Dr. Ane Payment yesterday, plan to remove one of the drains today.  Hopefully both the drains can be removed by Wednesday  and discharged on oral antibiotics. Ordered soft diet for now and pain medications available as needed.  DC IV fluids.  Peridex mouth rinse twice daily-will need 2-week supply upon discharge.  Hyperglycemia: Likely stress-induced in the setting of acute infection.  Hemoglobin A1c 5.5 and no history of diabetes mellitus.  Secondary hypertension: Elevated BP likely in the setting of problem #1 and pain.  BP normalized now, monitor.  Hyponatremia/hypokalemia: Resolved with IV fluids and potassium replacement.. DC standing potassium orders.  Labs in a.m.   DVT prophylaxis: Added Lovenox as okay per surgery. Code Status: Full code Family / Patient Communication: Discussed with patient and Dr. Dory Larsen Disposition Plan: Home on Wednesday or Thursday based on clinical progress when drains removed.     LOS: 3 days    Time spent: 25 minutes    Guilford Shi, MD [facility] Pager in [place]  If 7PM-7AM, please contact night-coverage [email or web address] Password [facility] 08/03/2019, 1:49 PM

## 2019-08-03 NOTE — Progress Notes (Signed)
Subjective:    Jordan Ball is a healthy 34 y.o. male who is now POD 2 s/p incision and drainage of the right submandibular, sublingual, submental, and masticator spaces as well as extraction of tooth #32.Patient continues to do well subjectively with less purulence appreciated from nursing staff as they have irrigated drains.    Objective:    BP 132/81 (BP Location: Right Arm)   Pulse 60   Temp 98.9 F (37.2 C)   Resp 17   Ht 6' (1.829 m)   Wt 90.7 kg   SpO2 95%   BMI 27.12 kg/m   General:   alert, not in distress  HEENT:   Continued erythema of the right neck with induration appreciated to the inferior border of the mandible but improved when compared to yesterday. Continue improved of erythema of the neck. Induration of the surgical site consistent with expected post-operative findings. No purulence appreciated around drains this evening. Intraorally, incisions are well approximated with sutures in place. MIO is 30cm.   CV:   RRR  Pulm  NWB   ABD:  Soft, Non-distended   Extremities  WWP     Aerobic/Anaerobic Cultures: Gram Stain MODERATE WBC PRESENT, PREDOMINANTLY PMN  FEW GRAM POSITIVE COCCI IN CLUSTERS IN CHAINS  RARE GRAM POSITIVE RODS   Culture RARE STAPHYLOCOCCUS EPIDERMIDIS      Assessment:   Jordan Ball is a healthy 34 y.o. male who is now POD 2 s/p incision and drainage of the right submandibular, sublingual, submental, and masticator spaces as well as extraction of tooth #32. He is progressing well since surgery with expected swelling and discomfort. CRP and CBC have decreased significantly over night (CRP:34.2 to 15.9 and CBC: 26.4 to 15.9). Given his clinical and lab findings, I have removed the buccal drain tonight and backed out the lateral pharyngeal drain 1/2 way. I will re-evaluated the patient tomorrow morning once I have confirmed continued down trend of his CRP and CBC, with plan to remove final drain in the morning. Once final drain is removed,  patient can be discharged home, but should continue irrigating neck would with NS until it closes.    Plan:    1. Patient should maintain a soft diet until drains are removed 2. Irrigate Drains TID using betadine in NS- (I have instructed nursing staff on the floor how to perform this)  3. Please trend CRP and CBC daily 4. Assuming patient's clinical presentation and labs continue to improve I will likely remove final drain Wednesday and patient will be discharged after.  5. Continue Unasyn while admitted, will likely transition to Augmentin at discharge (10 day course) pending cultures from OR.  6. Please provide peridex mouth rinse twice daily while admitted and discharge with a 2 week supply.  7. Defer pain management to primary team, but normal post I&D/extractions require Norco 5/325 for 3-4 days. 8. OK for DVT prophylaxis from a bleeding perspective, Moderate risk of DVT following surgery in the setting of elevated inflammation and being in the immediate postoperative period with limited ambulation.   9. Please have nursing staff educate him on how to irrigate drain site at home.  Follow up after discharge: Once cleared for discharge, Patient should follow up in 1 week at my office below. Please provide contact information to patient and ask him to contact our office to schedule and appointment.   Dr. Valda Favia  [facility]  [phone]  [address] [place], [place] [address]

## 2019-08-04 DIAGNOSIS — A419 Sepsis, unspecified organism: Secondary | ICD-10-CM

## 2019-08-04 LAB — CBC
HCT: 43.6 % (ref 39.0–52.0)
Hemoglobin: 14.4 g/dL (ref 13.0–17.0)
MCH: 32 pg (ref 26.0–34.0)
MCHC: 33 g/dL (ref 30.0–36.0)
MCV: 96.9 fL (ref 80.0–100.0)
Platelets: 407 10*3/uL — ABNORMAL HIGH (ref 150–400)
RBC: 4.5 MIL/uL (ref 4.22–5.81)
RDW: 13.1 % (ref 11.5–15.5)
WBC: 16.9 10*3/uL — ABNORMAL HIGH (ref 4.0–10.5)
nRBC: 0 % (ref 0.0–0.2)

## 2019-08-04 LAB — GLUCOSE, CAPILLARY
Glucose-Capillary: 108 mg/dL — ABNORMAL HIGH (ref 70–99)
Glucose-Capillary: 90 mg/dL (ref 70–99)

## 2019-08-04 LAB — BASIC METABOLIC PANEL
Anion gap: 14 (ref 5–15)
BUN: 13 mg/dL (ref 6–20)
CO2: 25 mmol/L (ref 22–32)
Calcium: 9.3 mg/dL (ref 8.9–10.3)
Chloride: 101 mmol/L (ref 98–111)
Creatinine, Ser: 0.94 mg/dL (ref 0.61–1.24)
GFR calc Af Amer: >60 mL/min (ref >60)
GFR calc non Af Amer: >60 mL/min (ref >60)
Glucose, Bld: 112 mg/dL — ABNORMAL HIGH (ref 70–99)
Potassium: 4.2 mmol/L (ref 3.5–5.1)
Sodium: 140 mmol/L (ref 135–145)

## 2019-08-04 LAB — C-REACTIVE PROTEIN: CRP: 10.3 mg/dL — ABNORMAL HIGH (ref <1.0)

## 2019-08-04 MED ORDER — OXYCODONE HCL 5 MG PO TABS: 5.0000 mg | ORAL_TABLET | Freq: Four times a day (QID) | ORAL | 0 refills | Status: AC | PRN

## 2019-08-04 MED ORDER — CHLORHEXIDINE GLUCONATE 0.12 % MT SOLN: 15.0000 mL | Freq: Two times a day (BID) | OROMUCOSAL | 0 refills | Status: AC

## 2019-08-04 MED FILL — CHLORHEXIDINE 0.12% RINSE: 0.12 | 14 days supply | Qty: 473 | Fill #0

## 2019-08-04 MED FILL — oxyCODONE HCL 5 MG TABS: 5 | 3 days supply | Qty: 12 | Fill #0

## 2019-08-04 NOTE — Progress Notes (Signed)
Subjective:    Jordan Ball is a healthy 34 y.o. male who is now POD 3 s/p incision and drainage of the right submandibular, sublingual, submental, and masticator spaces as well as extraction of tooth #32. Patient is doing well overnight, reports continued improvement in symptoms. He does report drainage into his mouth during irrigation.    Objective:    BP (!) 139/93   Pulse (!) 54   Temp 98.2 F (36.8 C)   Resp 19   Ht 6' (1.829 m)   Wt 90.7 kg   SpO2 97%   BMI 27.12 kg/m   General:   alert, not in distress  HEENT:   Continued erythema of the right neck with induration appreciated to the inferior border of the mandible but improved when compared to yesterday. Continue improved of erythema of the neck. Induration of the surgical site consistent with expected post-operative findings. No purulence appreciated around drains this morning. Intraorally, incisions are well approximated with sutures in place. MIO is 30cm.   CV:   RRR  Pulm  NWB   ABD:  Soft, Non-distended   Extremities  WWP     Aerobic/Anaerobic Cultures: Gram Stain MODERATE WBC PRESENT, PREDOMINANTLY PMN  FEW GRAM POSITIVE COCCI IN CLUSTERS IN CHAINS  RARE GRAM POSITIVE RODS   Culture RARE STAPHYLOCOCCUS EPIDERMIDIS      Assessment:   Jordan Ball is a healthy 33 y.o. male who is now POD 2 s/p incision and drainage of the right submandibular, sublingual, submental, and masticator spaces as well as extraction of tooth #32. He is progressing well since surgery with expected swelling and discomfort. CBC has minimally bumped to 16.9 from 15.9 yesterday. I suspect this to be within the margin of error of the test vs hemoconcentration. He did not have a CRP drawn, but I will request this be added on to the CBC drawn this morning. I will follow up on the CRP and unless it has significantly risen, the patient should be safe for discharge home with close follow up next week at my office.  Plan:    1. Patient should  maintain a soft diet until drains are removed 2. Irrigate Drains TID using betadine in NS- (I have instructed nursing staff on the floor how to perform this)  3. Please trend CRP and CBC daily 4. Final drain removed this morning.  5. Continue Unasyn while admitted, will likely transition to Augmentin at discharge (10 day course) pending cultures from OR.  6. Please provide peridex mouth rinse twice daily while admitted and discharge with a 2 week supply.  7. Defer pain management to primary team, but normal post I&D/extractions require Norco 5/325 for 3-4 days. 8. OK for DVT prophylaxis from a bleeding perspective, Moderate risk of DVT following surgery in the setting of elevated inflammation and being in the immediate postoperative period with limited ambulation.   9. Please have nursing staff educate him on how to irrigate drain site at home.  Follow up after discharge: Once cleared for discharge, Patient should follow up in 1 week at my office below. Please provide contact information to patient and ask him to contact our office to schedule and appointment.   Dr. Valda Favia  [facility]  [phone]  [address] [place], [place] [address]

## 2019-08-04 NOTE — Discharge Summary (Signed)
. Physician Discharge Summary  Jordan Ball EUM:[id number] DOB: [birth year] DOA: 07/31/2019  PCP: [facility]  Admit date: 07/31/2019 Discharge date: 08/04/2019  Admitted From: Home Disposition:  Discharged to home.   Recommendations for Outpatient Follow-up:  1. Follow up with PCP in 1-2 weeks 2. Please obtain BMP/CBC in one week  Discharge Condition: Stable  CODE STATUS: FULL   Brief/Interim Summary: 34 year old male with history of poor dentition presents with 4-day history of progressively worsening jaw swelling associated with fever, chills and difficulty maintaining oral intake.  He also reports vomiting.  Patient noted to be tachycardic, tachypneic with leukocytosis and lactic acidosis 2.2 in the ED.  Facial CT revealed Ludwick's angina with abscess. Hospital course: Patient admitted to [facility] for further evaluation and management with oral surgery consultation.  Incision and drainage ofthe rightsubmandibular, sublingual, submental, and masticator spaces as well as extraction of tooth #32 on 08/01/2019. Oral Surgery following  08/04/19: Drains removed. Transition to augmentin. Ok to discharge to home,  Discharge Diagnoses:  Principal Problem:   Sepsis (HCC) Active Problems:   Ludwig's angina   Elevated BP without diagnosis of hypertension   Hyperglycemia   Hypokalemia   Hyponatremia  Right mandibular/dental abscess with Ludwick's angina, sepsis:      - Present on admission.       - S/p incision and drainage by orofacial surgery; placed on unasyn          - CRP 39-->34 --> 15.9 --> 10.3     - Cx w/ MODERATE WBC PRESENT, PREDOMINANTLY PMN; FEW GRAM POSITIVE COCCI IN CLUSTERS IN CHAINS; RARE GRAM POSITIVE RODS; Culture: RARE STAPHYLOCOCCUS EPIDERMIDIS     - improved on current regimen     - Peridex mouth rinse twice daily-will need 2-week supply upon discharge.     - augmentin to complete 10-day course. Follow up with Oral surgery in 1 week. Continue oral  rinse for 2 weeks.      Hyperglycemia     - Likely stress-induced in the setting of acute infection.       - Hemoglobin A1c 5.5 and no history of diabetes mellitus.     - resolved  Secondary hypertension:      - Elevated BP likely in the setting of problem #1 and pain.       - BP normalized now, monitor.     - Follow up with PCP  Hyponatremia Hypokalemia     - resolved on IVF and K+ replacement; stable  Discharge Instructions   Allergies as of 08/04/2019   No Known Allergies     Medication List    TAKE these medications   acetaminophen 500 MG tablet Commonly known as: TYLENOL Take 500 mg by mouth every 6 (six) hours as needed for mild pain or moderate pain.   amoxicillin-clavulanate 875-125 MG tablet Commonly known as: AUGMENTIN Take 1 tablet by mouth every 12 (twelve) hours.   chlorhexidine 0.12 % solution Commonly known as: PERIDEX Use as directed 15 mLs in the mouth or throat in the morning and at bedtime for 14 days.   FLUoxetine 40 MG capsule Commonly known as: PROZAC Take 40 mg by mouth daily.   oxyCODONE 5 MG immediate release tablet Commonly known as: Oxy IR/ROXICODONE Take 1 tablet (5 mg total) by mouth every 6 (six) hours as needed for up to 3 days for moderate pain.       No Known Allergies  Consultations:  OMFS   Procedures/Studies: CT Soft Tissue  Neck W Contrast  Result Date: 07/31/2019 CLINICAL DATA:  Throat pain, swelling in right side of jaw, difficult to speak and swallow, vomiting for 4 days. EXAM: CT NECK WITH CONTRAST TECHNIQUE: Multidetector CT imaging of the neck was performed using the standard protocol following the bolus administration of intravenous contrast. CONTRAST:  75mL OMNIPAQUE IOHEXOL 300 MG/ML  SOLN COMPARISON:  No pertinent prior studies available for comparison. FINDINGS: Motion degraded examination. Pharynx and larynx: There is prominent soft tissue swelling centered along the posterior right mandible at site of a  carious posterior right mandibular molar. There is subtle periapical lucency surrounding this carious posterior right mandibular molar. here is circumscribed hypodensity centered at this site along both the superficial and deep aspects of the mandible measuring 2.7 x 2.1 x 2.1 cm likely reflecting soft tissue abscess (series 2, image 44) (series 4, image 47). There is additional surrounding perimandibular phlegmon. Edema tracks into the right floor of mouth were there is associated myositis (series 4, image 37). Edema also extends more inferiorly within the neck. Edema also extends to the submucosal right pharyngeal and parapharyngeal regions. The oropharyngeal airway remains patent. The larynx is unremarkable. Salivary glands: Likely reactive edema and swelling of the right submandibular gland. The bilateral parotid and left submandibular glands are unremarkable. Thyroid: No abnormality Lymph nodes: Upper cervical chain lymphadenopathy (greater on the right), likely reactive. Vascular: The major vascular structures of the neck appear patent. Limited intracranial: No abnormality identified. Visualized orbits: Visualized orbits demonstrate no acute abnormality. Mastoids and visualized paranasal sinuses: Moderate-sized right maxillary sinus mucous retention cyst. No significant mastoid effusion. Skeleton: No acute bony abnormality. Upper chest: No consolidation within the imaged lung apices. These results were called by telephone at the time of interpretation on 07/31/2019 at 5:44 pm to provider PA [facility] FOR[facility] , who verbally acknowledged these results. IMPRESSION: Perimandibular, floor of mouth and upper neck soft tissue infection which appears centered along the right posterior mandible where there is a carious posterior right mandibular molar with periapical lucency. Circumscribed hypodensity along the superficial and deep aspects of the mandible at this site measuring 2.7 x 2.1 x 2.1 cm likely reflecting soft tissue  abscess. Edema not only tracks into the floor of mouth but also into the submucosal right pharyngeal and parapharyngeal regions. The oropharyngeal airway remains patent. Upper cervical lymphadenopathy, likely reactive. Electronically Signed   By: Jackey LogeKyle  Golden DO   On: 07/31/2019 17:45      Subjective: "I don't have the drain anymore. I'm good to go."  Discharge Exam: Vitals:   08/03/19 2000 08/04/19 0735  BP: 124/80 (!) 139/93  Pulse: 60 (!) 54  Resp: 16 19  Temp: 98 F (36.7 C) 98.2 F (36.8 C)  SpO2:  97%   Vitals:   08/02/19 2000 08/03/19 0853 08/03/19 2000 08/04/19 0735  BP: (!) 141/80 132/81 124/80 (!) 139/93  Pulse:  60 60 (!) 54  Resp:  17 16 19   Temp:  98.9 F (37.2 C) 98 F (36.7 C) 98.2 F (36.8 C)  TempSrc: Oral  Oral   SpO2: 96% 95%  97%  Weight:      Height:        General: 34 y.o. male resting in bed in NAD Cardiovascular: RRR, +S1, S2, no m/g/r, equal pulses throughout Respiratory: CTABL, no w/r/r, normal WOB GI: BS+, NDNT, no masses noted, no organomegaly noted MSK: No e/c/c Neuro: A&O x 3, no focal deficits Psyc: Appropriate interaction and affect, calm/cooperative   The results  of significant diagnostics from this hospitalization (including imaging, microbiology, ancillary and laboratory) are listed below for reference.     Microbiology: Recent Results (from the past 240 hour(s))  Respiratory Panel by RT PCR (Flu A&B, Covid) - Nasopharyngeal Swab     Status: None   Collection Time: 07/31/19  5:56 PM   Specimen: Nasopharyngeal Swab  Result Value Ref Range Status   SARS Coronavirus 2 by RT PCR NEGATIVE NEGATIVE Final    Comment: (NOTE) SARS-CoV-2 target nucleic acids are NOT DETECTED. The SARS-CoV-2 RNA is generally detectable in upper respiratoy specimens during the acute phase of infection. The lowest concentration of SARS-CoV-2 viral copies this assay can detect is 131 copies/mL. A negative result does not preclude SARS-Cov-2 infection and  should not be used as the sole basis for treatment or other patient management decisions. A negative result may occur with  improper specimen collection/handling, submission of specimen other than nasopharyngeal swab, presence of viral mutation(s) within the areas targeted by this assay, and inadequate number of viral copies (<131 copies/mL). A negative result must be combined with clinical observations, patient history, and epidemiological information. The expected result is Negative. Fact Sheet for Patients:  https://www.moore.com/ Fact Sheet for Healthcare Providers:  https://www.young.biz/ This test is not yet ap proved or cleared by the [place] FDA and  has been authorized for detection and/or diagnosis of SARS-CoV-2 by FDA under an Emergency Use Authorization (EUA). This EUA will remain  in effect (meaning this test can be used) for the duration of the COVID-19 declaration under Section 564(b)(1) of the Act, 21 U.S.C. section 360bbb-3(b)(1), unless the authorization is terminated or revoked sooner.    Influenza A by PCR NEGATIVE NEGATIVE Final   Influenza B by PCR NEGATIVE NEGATIVE Final    Comment: (NOTE) The Xpert Xpress SARS-CoV-2/FLU/RSV assay is intended as an aid in  the diagnosis of influenza from Nasopharyngeal swab specimens and  should not be used as a sole basis for treatment. Nasal washings and  aspirates are unacceptable for Xpert Xpress SARS-CoV-2/FLU/RSV  testing. Fact Sheet for Patients: https://www.moore.com/ Fact Sheet for Healthcare Providers: https://www.young.biz/ This test is not yet approved or cleared by the [place] FDA and  has been authorized for detection and/or diagnosis of SARS-CoV-2 by  FDA under an Emergency Use Authorization (EUA). This EUA will remain  in effect (meaning this test can be used) for the duration of the  Covid-19 declaration under Section  564(b)(1) of the Act, 21  U.S.C. section 360bbb-3(b)(1), unless the authorization is  terminated or revoked. Performed at [facility], [address], [place], [place] [address]   Culture, blood (routine x 2)     Status: None (Preliminary result)   Collection Time: 07/31/19  6:00 PM   Specimen: Right Antecubital; Blood  Result Value Ref Range Status   Specimen Description RIGHT ANTECUBITAL  Final   Special Requests   Final    BOTTLES DRAWN AEROBIC AND ANAEROBIC Blood Culture adequate volume   Culture   Final    NO GROWTH 4 DAYS Performed at [facility], [address], [place], [place] [address]    Report Status PENDING  Incomplete  Culture, blood (routine x 2)     Status: None (Preliminary result)   Collection Time: 07/31/19  6:11 PM   Specimen: BLOOD RIGHT HAND  Result Value Ref Range Status   Specimen Description BLOOD RIGHT HAND  Final   Special Requests   Final    BOTTLES DRAWN AEROBIC AND ANAEROBIC  Blood Culture adequate volume   Culture   Final    NO GROWTH 4 DAYS Performed at [facility], [address] [place], [place] [address]    Report Status PENDING  Incomplete  Aerobic/Anaerobic Culture (surgical/deep wound)     Status: None (Preliminary result)   Collection Time: 08/01/19  8:25 AM   Specimen: PATH ENT excision; Tissue  Result Value Ref Range Status   Specimen Description TISSUE ABSCESS NECK  Final   Special Requests NONE  Final   Gram Stain   Final    MODERATE WBC PRESENT, PREDOMINANTLY PMN FEW GRAM POSITIVE COCCI IN CLUSTERS IN CHAINS RARE GRAM POSITIVE RODS    Culture   Final    RARE STAPHYLOCOCCUS EPIDERMIDIS SUSCEPTIBILITIES TO FOLLOW Performed at [facility], [address] [place], [place] [address]    Report Status PENDING  Incomplete  Acid Fast Smear (AFB)     Status: None   Collection Time: 08/01/19  8:25 AM   Specimen: PATH ENT excision; Tissue  Result Value Ref Range Status   AFB Specimen Processing Concentration  Final   Acid Fast  Smear Negative  Final    Comment: (NOTE) Performed At: [facility] [address] [place], [place] [id number] Jolene Schimke MD OI:[id number]    Source (AFB) TISSUE  Final    Comment: ABSCESS NECK Performed at [facility], [address] [place], [place] [address]   MRSA PCR Screening     Status: None   Collection Time: 08/02/19 12:26 PM   Specimen: Nasal Mucosa; Nasopharyngeal  Result Value Ref Range Status   MRSA by PCR NEGATIVE NEGATIVE Final    Comment:        The GeneXpert MRSA Assay (FDA approved for NASAL specimens only), is one component of a comprehensive MRSA colonization surveillance program. It is not intended to diagnose MRSA infection nor to guide or monitor treatment for MRSA infections. Performed at [facility], [address], [place], [place] [address]      Labs: BNP (last 3 results) No results for input(s): BNP in the last 8760 hours. Basic Metabolic Panel: Recent Labs  Lab 07/31/19 1540 07/31/19 1818 08/01/19 0221 08/02/19 0241 08/04/19 0228  NA 130*  --  133* 139 140  K 3.1*  --  3.8 3.5 4.2  CL 90*  --  92* 103 101  CO2 23  --  27 27 25   GLUCOSE 162*  --  97 119* 112*  BUN 23*  --  14 13 13   CREATININE 1.14  --  1.11 0.94 0.94  CALCIUM 9.7  --  9.0 8.8* 9.3  MG  --  2.0  --   --   --    Liver Function Tests: Recent Labs  Lab 08/02/19 0241  AST 32  ALT 50*  ALKPHOS 105  BILITOT 0.6  PROT 6.0*  ALBUMIN 2.3*   No results for input(s): LIPASE, AMYLASE in the last 168 hours. No results for input(s): AMMONIA in the last 168 hours. CBC: Recent Labs  Lab 07/31/19 1540 08/01/19 0221 08/02/19 0241 08/03/19 0809 08/04/19 0228  WBC 36.1* 26.8* 26.4* 15.9* 16.9*  NEUTROABS 28.9*  --   --   --   --   HGB 18.3* 15.6 13.3 14.0 14.4  HCT 51.2 44.6 38.3* 40.7 43.6  MCV 90.8 92.7 93.2 94.2 96.9  PLT 297 291 289 378 407*   Cardiac Enzymes: No results for input(s): CKTOTAL, CKMB, CKMBINDEX, TROPONINI in the  last  168 hours. BNP: Invalid input(s): POCBNP CBG: Recent Labs  Lab 08/02/19 1613 08/02/19 2042 08/03/19 1035 08/03/19 2231 08/04/19 0819  GLUCAP 123* 110* 106* 97 108*   D-Dimer No results for input(s): DDIMER in the last 72 hours. Hgb A1c No results for input(s): HGBA1C in the last 72 hours. Lipid Profile No results for input(s): CHOL, HDL, LDLCALC, TRIG, CHOLHDL, LDLDIRECT in the last 72 hours. Thyroid function studies No results for input(s): TSH, T4TOTAL, T3FREE, THYROIDAB in the last 72 hours.  Invalid input(s): FREET3 Anemia work up No results for input(s): VITAMINB12, FOLATE, FERRITIN, TIBC, IRON, RETICCTPCT in the last 72 hours. Urinalysis No results found for: COLORURINE, APPEARANCEUR, [place], [place], [place], [place], [place], [place], PROTEINUR, UROBILINOGEN, NITRITE, LEUKOCYTESUR Sepsis Labs Invalid input(s): PROCALCITONIN,  WBC,  LACTICIDVEN Microbiology Recent Results (from the past 240 hour(s))  Respiratory Panel by RT PCR (Flu A&B, Covid) - Nasopharyngeal Swab     Status: None   Collection Time: 07/31/19  5:56 PM   Specimen: Nasopharyngeal Swab  Result Value Ref Range Status   SARS Coronavirus 2 by RT PCR NEGATIVE NEGATIVE Final    Comment: (NOTE) SARS-CoV-2 target nucleic acids are NOT DETECTED. The SARS-CoV-2 RNA is generally detectable in upper respiratoy specimens during the acute phase of infection. The lowest concentration of SARS-CoV-2 viral copies this assay can detect is 131 copies/mL. A negative result does not preclude SARS-Cov-2 infection and should not be used as the sole basis for treatment or other patient management decisions. A negative result may occur with  improper specimen collection/handling, submission of specimen other than nasopharyngeal swab, presence of viral mutation(s) within the areas targeted by this assay, and inadequate number of viral copies (<131 copies/mL). A negative result must be combined with  clinical observations, patient history, and epidemiological information. The expected result is Negative. Fact Sheet for Patients:  PinkCheek.be Fact Sheet for Healthcare Providers:  GravelBags.it This test is not yet ap proved or cleared by the [place] FDA and  has been authorized for detection and/or diagnosis of SARS-CoV-2 by FDA under an Emergency Use Authorization (EUA). This EUA will remain  in effect (meaning this test can be used) for the duration of the COVID-19 declaration under Section 564(b)(1) of the Act, 21 U.S.C. section 360bbb-3(b)(1), unless the authorization is terminated or revoked sooner.    Influenza A by PCR NEGATIVE NEGATIVE Final   Influenza B by PCR NEGATIVE NEGATIVE Final    Comment: (NOTE) The Xpert Xpress SARS-CoV-2/FLU/RSV assay is intended as an aid in  the diagnosis of influenza from Nasopharyngeal swab specimens and  should not be used as a sole basis for treatment. Nasal washings and  aspirates are unacceptable for Xpert Xpress SARS-CoV-2/FLU/RSV  testing. Fact Sheet for Patients: PinkCheek.be Fact Sheet for Healthcare Providers: GravelBags.it This test is not yet approved or cleared by the [place] FDA and  has been authorized for detection and/or diagnosis of SARS-CoV-2 by  FDA under an Emergency Use Authorization (EUA). This EUA will remain  in effect (meaning this test can be used) for the duration of the  Covid-19 declaration under Section 564(b)(1) of the Act, 21  U.S.C. section 360bbb-3(b)(1), unless the authorization is  terminated or revoked. Performed at [facility], [address] [place], [place] [address]   Culture, blood (routine x 2)     Status: None (Preliminary result)   Collection Time: 07/31/19  6:00 PM   Specimen: Right Antecubital; Blood  Result Value Ref Range Status   Specimen  Description RIGHT  ANTECUBITAL  Final   Special Requests   Final    BOTTLES DRAWN AEROBIC AND ANAEROBIC Blood Culture adequate volume   Culture   Final    NO GROWTH 4 DAYS Performed at [facility], [address] [place], [place] [address]    Report Status PENDING  Incomplete  Culture, blood (routine x 2)     Status: None (Preliminary result)   Collection Time: 07/31/19  6:11 PM   Specimen: BLOOD RIGHT HAND  Result Value Ref Range Status   Specimen Description BLOOD RIGHT HAND  Final   Special Requests   Final    BOTTLES DRAWN AEROBIC AND ANAEROBIC Blood Culture adequate volume   Culture   Final    NO GROWTH 4 DAYS Performed at [facility], [address] [place], [place] [address]    Report Status PENDING  Incomplete  Aerobic/Anaerobic Culture (surgical/deep wound)     Status: None (Preliminary result)   Collection Time: 08/01/19  8:25 AM   Specimen: PATH ENT excision; Tissue  Result Value Ref Range Status   Specimen Description TISSUE ABSCESS NECK  Final   Special Requests NONE  Final   Gram Stain   Final    MODERATE WBC PRESENT, PREDOMINANTLY PMN FEW GRAM POSITIVE COCCI IN CLUSTERS IN CHAINS RARE GRAM POSITIVE RODS    Culture   Final    RARE STAPHYLOCOCCUS EPIDERMIDIS SUSCEPTIBILITIES TO FOLLOW Performed at [facility], [address], [place], [place] [address]    Report Status PENDING  Incomplete  Acid Fast Smear (AFB)     Status: None   Collection Time: 08/01/19  8:25 AM   Specimen: PATH ENT excision; Tissue  Result Value Ref Range Status   AFB Specimen Processing Concentration  Final   Acid Fast Smear Negative  Final    Comment: (NOTE) Performed At: [facility] [address] [place], [place] [id number] Jolene Schimke MD OA:[id number]    Source (AFB) TISSUE  Final    Comment: ABSCESS NECK Performed at [facility], [address] [place], [place] [address]   MRSA PCR Screening     Status: None   Collection Time: 08/02/19 12:26 PM   Specimen: Nasal  Mucosa; Nasopharyngeal  Result Value Ref Range Status   MRSA by PCR NEGATIVE NEGATIVE Final    Comment:        The GeneXpert MRSA Assay (FDA approved for NASAL specimens only), is one component of a comprehensive MRSA colonization surveillance program. It is not intended to diagnose MRSA infection nor to guide or monitor treatment for MRSA infections. Performed at [facility], [address], [place], [place] [address]      Time coordinating discharge: 35 minutes  SIGNED:   Teddy Spike, DO  [facility] 08/04/2019, 11:35 AM   If 7PM-7AM, please contact night-coverage [email or web address]

## 2019-08-05 LAB — CULTURE, BLOOD (ROUTINE X 2)
Culture: NO GROWTH
Culture: NO GROWTH
Special Requests: ADEQUATE
Special Requests: ADEQUATE

## 2019-08-10 LAB — SUSCEPTIBILITY RESULT

## 2019-08-10 LAB — AEROBIC/ANAEROBIC CULTURE W GRAM STAIN (SURGICAL/DEEP WOUND)

## 2019-08-10 LAB — SUSCEPTIBILITY, AER + ANAEROB

## 2019-09-01 LAB — FUNGUS CULTURE WITH STAIN

## 2019-09-01 LAB — FUNGAL ORGANISM REFLEX

## 2019-09-01 LAB — FUNGUS CULTURE RESULT

## 2019-09-14 LAB — ACID FAST CULTURE WITH REFLEXED SENSITIVITIES (MYCOBACTERIA): Acid Fast Culture: NEGATIVE

## 2020-05-23 ENCOUNTER — Other Ambulatory Visit: Payer: Self-pay

## 2020-05-23 ENCOUNTER — Ambulatory Visit
Admission: EM | Admit: 2020-05-23 | Discharge: 2020-05-23 | Disposition: A | Discharge status: Home / Self Care | Payer: Managed Care, Other (non HMO) | Attending: Internal Medicine | Admitting: Internal Medicine

## 2020-05-23 DIAGNOSIS — R55 Syncope and collapse: Secondary | ICD-10-CM | POA: Diagnosis not present

## 2020-05-23 DIAGNOSIS — R42 Dizziness and giddiness: Secondary | ICD-10-CM | POA: Diagnosis not present

## 2020-05-23 LAB — POCT FASTING CBG KUC MANUAL ENTRY: POCT Glucose (KUC): 99 mg/dL (ref 70–99)

## 2020-05-23 NOTE — ED Provider Notes (Signed)
[facility]    CSN: [id number] Arrival date & time: 05/23/20  1039      History   Chief Complaint No chief complaint on file.   HPI Jordan Ball is a 34 y.o. male comes to the urgent care with complaints of dizziness and feeling sweaty.  Patient apparently almost passed out this morning when he was getting out of bed.  The event was unwitnessed.  He denied any chest pain or chest pressure.  No numbness or tingling.  He recovered promptly after he sat back in the bed.  No fever or chills.  Oral intake is good.  No diarrhea.  Patient has had these episodes in the past.  He denies any palpitations.  HPI  No past medical history on file.  Patient Active Problem List   Diagnosis Date Noted  . 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 Elevated BP without diagnosis of hypertension 07/31/2019  . Hyperglycemia 07/31/2019  . Sepsis (HCC) 07/31/2019  . Hypokalemia 07/31/2019  . Hyponatremia 07/31/2019  . Joint contracture of the lower leg 01/08/2011  . ACL injury tear 12/17/2010  . Acute medial meniscus tear of left knee 10/23/2010    Past Surgical History:  Procedure Laterality Date  . IRRIGATION AND DEBRIDEMENT ABSCESS N/A 08/01/2019   Procedure: INCISION AND DRAINAGE OF RIGHT SUBMANDIBULAR SPACE ABSCESS AND EXTRACTION OF TEETH;  Surgeon: Lovena Neighbours, MD;  Location: [facility];  Service: Plastics;  Laterality: N/A;  . KNEE SURGERY Left   . leftmiddle finger         Home Medications    Prior to Admission medications   Medication Sig Start Date End Date Taking? Authorizing Provider  acetaminophen (TYLENOL) 500 MG tablet Take 500 mg by mouth every 6 (six) hours as needed for mild pain or moderate pain.    [provider]  amoxicillin-clavulanate (AUGMENTIN) 875-125 MG tablet Take 1 tablet by mouth every 12 (twelve) hours. 07/31/19   Avegno, Zachery Dakins, FNP  FLUoxetine (PROZAC) 40 MG capsule Take 40 mg by mouth daily. 06/29/19   [provider]     Family History Family History  Problem Relation Age of Onset  . Diabetes Other   . Healthy Mother     Social History Social History   Tobacco Use  . Smoking status: Never Smoker  . Smokeless tobacco: Never Used  Vaping Use  . Vaping Use: Never used  Substance Use Topics  . Alcohol use: Yes    Comment: Occ  . Drug use: No     Allergies   Patient has no known allergies.   Review of Systems Review of Systems  Constitutional: Negative.   HENT: Negative.   Musculoskeletal: Negative.   Neurological: Positive for dizziness and light-headedness. Negative for syncope, weakness and headaches.     Physical Exam Triage Vital Signs ED Triage Vitals [05/23/20 1118]  Enc Vitals Group     BP (!) 138/98     Pulse Rate 89     Resp 17     Temp 98.9 F (37.2 C)     Temp Source Oral     SpO2 94 %     Weight      Height      Head Circumference      Peak Flow      Pain Score      Pain Loc      Pain Edu?      Excl. in GC?    Orthostatic VS for the past 24  hrs:  BP- Lying Pulse- Lying BP- Sitting Pulse- Sitting BP- Standing at 0 minutes Pulse- Standing at 0 minutes  05/23/20 1214 105/66 65 118/75 90 119/82 82    Updated Vital Signs BP (!) 138/98 (BP Location: Right Arm)   Pulse 89   Temp 98.9 F (37.2 C) (Oral)   Resp 17   SpO2 94%   Visual Acuity Right Eye Distance:   Left Eye Distance:   Bilateral Distance:    Right Eye Near:   Left Eye Near:    Bilateral Near:     Physical Exam Vitals and nursing note reviewed.  Constitutional:      General: He is not in acute distress.    Appearance: He is not ill-appearing.  Cardiovascular:     Rate and Rhythm: Normal rate and regular rhythm.     Pulses: Normal pulses.     Heart sounds: Normal heart sounds.  Pulmonary:     Effort: Pulmonary effort is normal.     Breath sounds: Normal breath sounds.  Neurological:     General: No focal deficit present.     Mental Status: He is alert and oriented to person,  place, and time.      UC Treatments / Results  Labs (all labs ordered are listed, but only abnormal results are displayed) Labs Reviewed  CBC WITH DIFFERENTIAL/PLATELET  COMPREHENSIVE METABOLIC PANEL  TSH  POCT FASTING CBG [facility] MANUAL ENTRY    EKG   Radiology No results found.  Procedures Procedures (including critical care time)  Medications Ordered in UC Medications - No data to display  Initial Impression / Assessment and Plan / UC Course  I have reviewed the triage vital signs and the nursing notes.  Pertinent labs & imaging results that were available during my care of the patient were reviewed by me and considered in my medical decision making (see chart for details).     1.  Postural dizziness with near syncope: Orthostatic blood pressures-heart rate increased by 25 when patient got up from a laying position to a sitting position. CBC, CMP, TSH. Point-of-care CBG was 99. Patient is advised to hydrate If symptoms persist patient may need to follow-up with a cardiologist for POTS evaluation. Final Clinical Impressions(s) / UC Diagnoses   Final diagnoses:  Postural dizziness with near syncope     Discharge Instructions     Please sign up for my chart so you can review your lab results When getting up from a laying or sitting position please do so in a graded fashion -Waiting a minute between laying and sitting down and -Waiting a minute between sitting down and standing up   ED Prescriptions    None     PDMP not reviewed this encounter.   Merrilee Jansky, MD 06/05/20 2004

## 2020-05-23 NOTE — Discharge Instructions (Signed)
Please sign up for my chart so you can review your lab results When getting up from a laying or sitting position please do so in a graded fashion -Waiting a minute between laying and sitting down and -Waiting a minute between sitting down and standing up

## 2020-05-24 LAB — COMPREHENSIVE METABOLIC PANEL
ALT: 39 IU/L (ref 0–44)
AST: 26 IU/L (ref 0–40)
Albumin/Globulin Ratio: 1.7 (ref 1.2–2.2)
Albumin: 4.7 g/dL (ref 4.0–5.0)
Alkaline Phosphatase: 86 IU/L (ref 44–121)
BUN/Creatinine Ratio: 11 (ref 9–20)
BUN: 12 mg/dL (ref 6–20)
Bilirubin Total: 0.3 mg/dL (ref 0.0–1.2)
CO2: 19 mmol/L — ABNORMAL LOW (ref 20–29)
Calcium: 9.9 mg/dL (ref 8.7–10.2)
Chloride: 105 mmol/L (ref 96–106)
Creatinine, Ser: 1.11 mg/dL (ref 0.76–1.27)
GFR calc Af Amer: 100 mL/min/{1.73_m2} (ref >59)
GFR calc non Af Amer: 86 mL/min/{1.73_m2} (ref >59)
Globulin, Total: 2.7 g/dL (ref 1.5–4.5)
Glucose: 96 mg/dL (ref 65–99)
Potassium: 4.3 mmol/L (ref 3.5–5.2)
Sodium: 141 mmol/L (ref 134–144)
Total Protein: 7.4 g/dL (ref 6.0–8.5)

## 2020-05-24 LAB — CBC WITH DIFFERENTIAL/PLATELET
Basophils Absolute: 0.2 10*3/uL (ref 0.0–0.2)
Basos: 2 %
EOS (ABSOLUTE): 0.4 10*3/uL (ref 0.0–0.4)
Eos: 4 %
Hematocrit: 53 % — ABNORMAL HIGH (ref 37.5–51.0)
Hemoglobin: 18.6 g/dL — ABNORMAL HIGH (ref 13.0–17.7)
Immature Grans (Abs): 0.1 10*3/uL (ref 0.0–0.1)
Immature Granulocytes: 1 %
Lymphocytes Absolute: 2.5 10*3/uL (ref 0.7–3.1)
Lymphs: 25 %
MCH: 31.8 pg (ref 26.6–33.0)
MCHC: 35.1 g/dL (ref 31.5–35.7)
MCV: 91 fL (ref 79–97)
Monocytes Absolute: 0.7 10*3/uL (ref 0.1–0.9)
Monocytes: 7 %
Neutrophils Absolute: 6.3 10*3/uL (ref 1.4–7.0)
Neutrophils: 61 %
Platelets: 359 10*3/uL (ref 150–450)
RBC: 5.84 x10E6/uL — ABNORMAL HIGH (ref 4.14–5.80)
RDW: 12.5 % (ref 11.6–15.4)
WBC: 10.1 10*3/uL (ref 3.4–10.8)

## 2020-05-24 LAB — TSH: TSH: 4.26 u[IU]/mL (ref 0.450–4.500)

## 2021-02-09 DIAGNOSIS — Z1331 Encounter for screening for depression: Secondary | ICD-10-CM | POA: Diagnosis not present

## 2021-02-09 DIAGNOSIS — K603 Anal fistula: Secondary | ICD-10-CM | POA: Diagnosis not present

## 2021-02-09 DIAGNOSIS — E6609 Other obesity due to excess calories: Secondary | ICD-10-CM | POA: Diagnosis not present

## 2021-02-09 DIAGNOSIS — Z6832 Body mass index (BMI) 32.0-32.9, adult: Secondary | ICD-10-CM | POA: Diagnosis not present

## 2021-02-09 DIAGNOSIS — K625 Hemorrhage of anus and rectum: Secondary | ICD-10-CM | POA: Diagnosis not present

## 2021-03-01 ENCOUNTER — Encounter: Payer: Self-pay | Admitting: General Surgery

## 2021-03-01 ENCOUNTER — Other Ambulatory Visit: Payer: Self-pay

## 2021-03-01 ENCOUNTER — Ambulatory Visit (INDEPENDENT_AMBULATORY_CARE_PROVIDER_SITE_OTHER): Payer: BC Managed Care – PPO | Admitting: General Surgery

## 2021-03-01 VITALS — BP 114/76 | HR 62 | Temp 98.6°F | Resp 12 | Ht 72.0 in | Wt 236.0 lb

## 2021-03-01 DIAGNOSIS — K603 Anal fistula: Secondary | ICD-10-CM

## 2021-03-01 NOTE — Progress Notes (Signed)
Jordan Ball; [id number]; March 23, 1986   HPI Patient is a 35 year old white male who was referred to my care by Dr. Sherwood Gambler for evaluation and treatment of the perianal fistula.  In addition, he has an umbilical hernia that is starting to cause him problems.  He states he has had the perianal fistula for approximately 1 month.  He has not noticed any stool or air coming through it.  Lately, only some bloody drainage is present.  It is tender to touch.  He has finished his antibiotic course.  He also has an umbilical hernia that seems to be increasing in size recently.  Is made worse with straining.  Patient denies any family history of inflammatory bowel disease. History reviewed. No pertinent past medical history.  Past Surgical History:  Procedure Laterality Date   IRRIGATION AND DEBRIDEMENT ABSCESS N/A 08/01/2019   Procedure: INCISION AND DRAINAGE OF RIGHT SUBMANDIBULAR SPACE ABSCESS AND EXTRACTION OF TEETH;  Surgeon: Lovena Neighbours, MD;  Location: [facility];  Service: Plastics;  Laterality: N/A;   KNEE SURGERY Left    leftmiddle finger      Family History  Problem Relation Age of Onset   Diabetes Other    Healthy Mother     Current Outpatient Medications on File Prior to Visit  Medication Sig Dispense Refill   acetaminophen (TYLENOL) 500 MG tablet Take 500 mg by mouth every 6 (six) hours as needed for mild pain or moderate pain.     No current facility-administered medications on file prior to visit.    No Known Allergies  Social History   Substance and Sexual Activity  Alcohol Use Yes   Comment: Occ    Social History   Tobacco Use  Smoking Status Never  Smokeless Tobacco Never    Review of Systems  Constitutional: Negative.   HENT: Negative.    Eyes: Negative.   Respiratory: Negative.    Cardiovascular: Negative.   Gastrointestinal:  Positive for abdominal pain.  Genitourinary: Negative.   Musculoskeletal: Negative.   Skin: Negative.   Neurological:  Negative.   Endo/Heme/Allergies: Negative.   Psychiatric/Behavioral: Negative.     Objective   Vitals:   03/01/21 0901  BP: 114/76  Pulse: 62  Resp: 12  Temp: 98.6 F (37 C)  SpO2: 96%    Physical Exam Vitals reviewed.  Constitutional:      Appearance: Normal appearance.  HENT:     Head: Normocephalic and atraumatic.  Cardiovascular:     Rate and Rhythm: Normal rate and regular rhythm.     Heart sounds: Normal heart sounds. No murmur heard.   No friction rub. No gallop.  Pulmonary:     Effort: Pulmonary effort is normal. No respiratory distress.     Breath sounds: Normal breath sounds. No stridor. No wheezing, rhonchi or rales.  Abdominal:     General: Bowel sounds are normal. There is no distension.     Palpations: Abdomen is soft. There is no mass.     Tenderness: There is no abdominal tenderness. There is no guarding or rebound.     Hernia: A hernia is present.     Comments: Reducible umbilical hernia, tender to touch.  Genitourinary:    Comments: Small fistulous opening at the 6 o'clock position outside the anal verge.  No induration noted.  Rectal examination did not reveal an opening in the rectum.  I was not able to express any purulent fluid or stool from the opening.  Silver nitrate was applied to the  opening and it did not track more than a few millimeters. Skin:    General: Skin is warm and dry.  Neurological:     Mental Status: He is alert and oriented to person, place, and time.    Assessment  Perianal fistula, resolving Umbilical hernia Plan  I told the patient to keep the wound wound clean and dry.  I did give him Rectiv samples for rectal pain.  Follow-up here in 2 weeks for a wound check.  Would like to delay any surgical intervention for his umbilical hernia at this time, which is fine with me.  Literature was given concerning his anal fistula.

## 2021-03-13 ENCOUNTER — Encounter (HOSPITAL_COMMUNITY): Payer: Self-pay | Admitting: *Deleted

## 2021-03-13 ENCOUNTER — Emergency Department (HOSPITAL_COMMUNITY): Payer: BC Managed Care – PPO

## 2021-03-13 ENCOUNTER — Other Ambulatory Visit: Payer: Self-pay

## 2021-03-13 ENCOUNTER — Emergency Department (HOSPITAL_COMMUNITY)
Admission: EM | Admit: 2021-03-13 | Discharge: 2021-03-13 | Disposition: A | Discharge status: Home / Self Care | Payer: BC Managed Care – PPO | Attending: Emergency Medicine | Admitting: Emergency Medicine

## 2021-03-13 DIAGNOSIS — K520 Gastroenteritis and colitis due to radiation: Secondary | ICD-10-CM | POA: Diagnosis not present

## 2021-03-13 DIAGNOSIS — R109 Unspecified abdominal pain: Secondary | ICD-10-CM

## 2021-03-13 DIAGNOSIS — K5790 Diverticulosis of intestine, part unspecified, without perforation or abscess without bleeding: Secondary | ICD-10-CM | POA: Diagnosis not present

## 2021-03-13 DIAGNOSIS — R197 Diarrhea, unspecified: Secondary | ICD-10-CM | POA: Diagnosis not present

## 2021-03-13 DIAGNOSIS — R1013 Epigastric pain: Secondary | ICD-10-CM | POA: Diagnosis not present

## 2021-03-13 DIAGNOSIS — K529 Noninfective gastroenteritis and colitis, unspecified: Secondary | ICD-10-CM | POA: Insufficient documentation

## 2021-03-13 DIAGNOSIS — K3189 Other diseases of stomach and duodenum: Secondary | ICD-10-CM | POA: Diagnosis not present

## 2021-03-13 DIAGNOSIS — K4090 Unilateral inguinal hernia, without obstruction or gangrene, not specified as recurrent: Secondary | ICD-10-CM | POA: Diagnosis not present

## 2021-03-13 LAB — URINALYSIS, ROUTINE W REFLEX MICROSCOPIC
Bilirubin Urine: NEGATIVE
Glucose, UA: NEGATIVE mg/dL
Hgb urine dipstick: NEGATIVE
Ketones, ur: NEGATIVE mg/dL
Leukocytes,Ua: NEGATIVE
Nitrite: NEGATIVE
Protein, ur: NEGATIVE mg/dL
Specific Gravity, Urine: >1.046 — ABNORMAL HIGH (ref 1.005–1.030)
pH: 5 (ref 5.0–8.0)

## 2021-03-13 LAB — LIPASE, BLOOD: Lipase: 24 U/L (ref 11–51)

## 2021-03-13 LAB — CBC
HCT: 54.6 % — ABNORMAL HIGH (ref 39.0–52.0)
Hemoglobin: 19.3 g/dL — ABNORMAL HIGH (ref 13.0–17.0)
MCH: 32.5 pg (ref 26.0–34.0)
MCHC: 35.3 g/dL (ref 30.0–36.0)
MCV: 92.1 fL (ref 80.0–100.0)
Platelets: 326 10*3/uL (ref 150–400)
RBC: 5.93 MIL/uL — ABNORMAL HIGH (ref 4.22–5.81)
RDW: 11.9 % (ref 11.5–15.5)
WBC: 11.8 10*3/uL — ABNORMAL HIGH (ref 4.0–10.5)
nRBC: 0 % (ref 0.0–0.2)

## 2021-03-13 LAB — COMPREHENSIVE METABOLIC PANEL
ALT: 45 U/L — ABNORMAL HIGH (ref 0–44)
AST: 27 U/L (ref 15–41)
Albumin: 4.9 g/dL (ref 3.5–5.0)
Alkaline Phosphatase: 85 U/L (ref 38–126)
Anion gap: 9 (ref 5–15)
BUN: 15 mg/dL (ref 6–20)
CO2: 23 mmol/L (ref 22–32)
Calcium: 9.9 mg/dL (ref 8.9–10.3)
Chloride: 105 mmol/L (ref 98–111)
Creatinine, Ser: 1.18 mg/dL (ref 0.61–1.24)
GFR, Estimated: >60 mL/min (ref >60)
Glucose, Bld: 97 mg/dL (ref 70–99)
Potassium: 3.8 mmol/L (ref 3.5–5.1)
Sodium: 137 mmol/L (ref 135–145)
Total Bilirubin: 0.7 mg/dL (ref 0.3–1.2)
Total Protein: 8.5 g/dL — ABNORMAL HIGH (ref 6.5–8.1)

## 2021-03-13 MED ORDER — SODIUM CHLORIDE 0.9 % IV SOLN
1000.0000 mL | INTRAVENOUS | Status: DC
  Administered 2021-03-13: 1000 mL via INTRAVENOUS

## 2021-03-13 MED ORDER — ONDANSETRON HCL 4 MG/2ML IJ SOLN
4.0000 mg | Freq: Once | INTRAMUSCULAR | Status: AC
  Administered 2021-03-13: 4 mg via INTRAVENOUS
  Filled 2021-03-13: qty 2

## 2021-03-13 MED ORDER — OMEPRAZOLE 20 MG PO CPDR: 20.0000 mg | DELAYED_RELEASE_CAPSULE | Freq: Every day | ORAL | 0 refills | Status: DC

## 2021-03-13 MED ORDER — DICYCLOMINE HCL 20 MG PO TABS: 20.0000 mg | ORAL_TABLET | Freq: Two times a day (BID) | ORAL | 0 refills | Status: DC

## 2021-03-13 MED ORDER — SODIUM CHLORIDE 0.9 % IV BOLUS (SEPSIS)
1000.0000 mL | Freq: Once | INTRAVENOUS | Status: AC
  Administered 2021-03-13: 1000 mL via INTRAVENOUS

## 2021-03-13 MED ORDER — IOHEXOL 350 MG/ML SOLN
100.0000 mL | Freq: Once | INTRAVENOUS | Status: AC | PRN
  Administered 2021-03-13: 80 mL via INTRAVENOUS

## 2021-03-13 MED ORDER — MORPHINE SULFATE (PF) 4 MG/ML IV SOLN
4.0000 mg | Freq: Once | INTRAVENOUS | Status: AC
  Administered 2021-03-13: 4 mg via INTRAVENOUS
  Filled 2021-03-13: qty 1

## 2021-03-13 NOTE — ED Triage Notes (Signed)
Abdominal pain, states he has a hernia and has seen Dr Lovell Sheehan for same

## 2021-03-13 NOTE — ED Notes (Signed)
Patient transported to CT 

## 2021-03-13 NOTE — ED Provider Notes (Signed)
[facility] EMERGENCY DEPARTMENT Provider Note   CSN: [id number] Arrival date & time: 03/13/21  1259     History Chief Complaint  Patient presents with   Abdominal Pain    Jordan Ball is a 35 y.o. male.  HPI  Patient presents to the ED for evaluation of abdominal pain.  Patient states he has a history of a ventral hernia.  He mentioned this previously to Dr. Lovell Sheehan a couple of weeks ago.  Patient was there to follow-up for a separate issue.  Patient was told they would reevaluate and discuss further in the next week or 2 at his next scheduled appointment.  Patient states however in the last few days he has had more severe pain in his upper abdomen.  He does feel the swelling of the hernia bulge in and out.  Pain is now moved up more to the epigastric region.  Hurts to move in certain positions.  He has not had any fevers or chills.  He has had some loose stools.  No dysuria.  History reviewed. No pertinent past medical history.  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   Elevated BP without diagnosis of hypertension 07/31/2019   Hyperglycemia 07/31/2019   Sepsis (HCC) 07/31/2019   Hypokalemia 07/31/2019   Hyponatremia 07/31/2019   Joint contracture of the lower leg 01/08/2011   ACL injury tear 12/17/2010   Acute medial meniscus tear of left knee 10/23/2010    Past Surgical History:  Procedure Laterality Date   IRRIGATION AND DEBRIDEMENT ABSCESS N/A 08/01/2019   Procedure: INCISION AND DRAINAGE OF RIGHT SUBMANDIBULAR SPACE ABSCESS AND EXTRACTION OF TEETH;  Surgeon: Lovena Neighbours, MD;  Location: [facility];  Service: Plastics;  Laterality: N/A;   KNEE SURGERY Left    leftmiddle finger         Family History  Problem Relation Age of Onset   Diabetes Other    Healthy Mother     Social History   Tobacco Use   Smoking status: Never   Smokeless tobacco: Never  Vaping Use   Vaping Use: Never used  Substance Use Topics    Alcohol use: Yes    Comment: Occ   Drug use: No    Home Medications Prior to Admission medications   Medication Sig Start Date End Date Taking? Authorizing Provider  acetaminophen (TYLENOL) 500 MG tablet Take 500 mg by mouth every 6 (six) hours as needed for mild pain or moderate pain.   Yes [provider]  dicyclomine (BENTYL) 20 MG tablet Take 1 tablet (20 mg total) by mouth 2 (two) times daily. 03/13/21  Yes Linwood Dibbles, MD  omeprazole (PRILOSEC) 20 MG capsule Take 1 capsule (20 mg total) by mouth daily for 10 days. 03/13/21 03/23/21 Yes Linwood Dibbles, MD    Allergies    Patient has no known allergies.  Review of Systems   Review of Systems  All other systems reviewed and are negative.  Physical Exam Updated Vital Signs BP 121/87   Pulse 76   Temp 98.3 F (36.8 C) (Oral)   Resp 15   Ht 1.829 m (6')   Wt 105.9 kg   SpO2 96%   BMI 31.68 kg/m   Physical Exam Vitals and nursing note reviewed.  Constitutional:      General: He is not in acute distress.    Appearance: He is well-developed.  HENT:     Head: Normocephalic and atraumatic.  Right Ear: External ear normal.     Left Ear: External ear normal.  Eyes:     General: No scleral icterus.       Right eye: No discharge.        Left eye: No discharge.     Conjunctiva/sclera: Conjunctivae normal.  Neck:     Trachea: No tracheal deviation.  Cardiovascular:     Rate and Rhythm: Normal rate and regular rhythm.  Pulmonary:     Effort: Pulmonary effort is normal. No respiratory distress.     Breath sounds: Normal breath sounds. No stridor. No wheezing or rales.  Abdominal:     General: Bowel sounds are normal. There is no distension.     Palpations: Abdomen is soft.     Tenderness: There is abdominal tenderness in the epigastric area. There is no guarding or rebound.     Comments: Soft periumbilical hernia, no erythema or induration noted  Musculoskeletal:        General: No tenderness or deformity.      Cervical back: Neck supple.  Skin:    General: Skin is warm and dry.     Findings: No rash.  Neurological:     General: No focal deficit present.     Mental Status: He is alert.     Cranial Nerves: No cranial nerve deficit (no facial droop, extraocular movements intact, no slurred speech).     Sensory: No sensory deficit.     Motor: No abnormal muscle tone or seizure activity.     Coordination: Coordination normal.  Psychiatric:        Mood and Affect: Mood normal.    ED Results / Procedures / Treatments   Labs (all labs ordered are listed, but only abnormal results are displayed) Labs Reviewed  COMPREHENSIVE METABOLIC PANEL - Abnormal; Notable for the following components:      Result Value   Total Protein 8.5 (*)    ALT 45 (*)    All other components within normal limits  CBC - Abnormal; Notable for the following components:   WBC 11.8 (*)    RBC 5.93 (*)    Hemoglobin 19.3 (*)    HCT 54.6 (*)    All other components within normal limits  URINALYSIS, ROUTINE W REFLEX MICROSCOPIC - Abnormal; Notable for the following components:   Specific Gravity, Urine >1.046 (*)    All other components within normal limits  LIPASE, BLOOD    EKG None  Radiology CT ABDOMEN PELVIS W CONTRAST  Result Date: 03/13/2021 CLINICAL DATA:  Abdomen pain with diarrhea EXAM: CT ABDOMEN AND PELVIS WITH CONTRAST TECHNIQUE: Multidetector CT imaging of the abdomen and pelvis was performed using the standard protocol following bolus administration of intravenous contrast. CONTRAST:  79mL OMNIPAQUE IOHEXOL 350 MG/ML SOLN COMPARISON:  None. FINDINGS: Lower chest: Lung bases demonstrate no acute consolidation or effusion. Normal cardiac size. 11 mm hypodense lesion with peripheral enhancement at the anterior dome of liver, series 2, image 6 likely represent small hemangioma. No calcified gallstone or biliary dilatation Hepatobiliary: No focal liver abnormality is seen. No gallstones, gallbladder wall  thickening, or biliary dilatation. Pancreas: Unremarkable. No pancreatic ductal dilatation or surrounding inflammatory changes. Spleen: Normal in size without focal abnormality. Adrenals/Urinary Tract: Adrenal glands are unremarkable. Kidneys are normal, without renal calculi, focal lesion, or hydronephrosis. Bladder is unremarkable. Stomach/Bowel: Stomach nonenlarged. Diverticular disease of the colon without acute wall thickening. Negative appendix. Possible mild thickening of pelvic small bowel loops with mild mucosal enhancement, for  example series 2, image 66 and series 2, image 71. Bowel does not appear dilated. Vascular/Lymphatic: No significant vascular findings are present. No enlarged abdominal or pelvic lymph nodes. Reproductive: Prostate is unremarkable. Other: No free air. Small free fluid in the pelvis. Small fat containing left inguinal hernia Musculoskeletal: No acute or significant osseous findings. IMPRESSION: 1. Small free fluid in the pelvis of uncertain source, no free air is seen. Suspicion of slightly thickened pelvic small bowel loops with mild mucosal enhancement as may be seen with infectious enteritis or inflammatory bowel disease. 2. Diverticular disease of the colon without acute wall thickening Electronically Signed   By: Jasmine Pang M.D.   On: 03/13/2021 17:29    Procedures Procedures   Medications Ordered in ED Medications  sodium chloride 0.9 % bolus 1,000 mL (0 mLs Intravenous Stopped 03/13/21 1825)    Followed by  0.9 %  sodium chloride infusion (1,000 mLs Intravenous New Bag/Given 03/13/21 1829)  ondansetron (ZOFRAN) injection 4 mg (4 mg Intravenous Given 03/13/21 1729)  morphine 4 MG/ML injection 4 mg (4 mg Intravenous Given 03/13/21 1730)  iohexol (OMNIPAQUE) 350 MG/ML injection 100 mL (80 mLs Intravenous Contrast Given 03/13/21 1706)    ED Course  I have reviewed the triage vital signs and the nursing notes.  Pertinent labs & imaging results that were available  during my care of the patient were reviewed by me and considered in my medical decision making (see chart for details).  Clinical Course as of 03/13/21 1925  Tue Mar 13, 2021  1642 White blood cell count elevated 11.8.  Metabolic panel unremarkable. [JK]  1738 1. Small free fluid in the pelvis of uncertain source, no free air is seen. Suspicion of slightly thickened pelvic small bowel loops with mild mucosal enhancement as may be seen with infectious enteritis or inflammatory bowel disease.   [JK]  1857 Hemoglobin increased at 19.3 elevated compared to previous values [JK]  1858 Urinalysis without signs of infection [JK]    Clinical Course User Index [JK] Linwood Dibbles, MD   MDM Rules/Calculators/A&P                          Physical exam not suggestive of incarcerated hernia however patient is having persistent pain.  He does have a leukocytosis.  We will proceed with CT scan  Patient CT scan shows some nonspecific findings but no signs of cholecystitis, diverticulitis, appendicitis or obstruction.  We will start the patient on antacids.  Also prescribe Bentyl to help with abdominal cramping.  Recommend outpatient follow-up with GI as well as hematology regarding his persistently elevated red blood cell count Final Clinical Impression(s) / ED Diagnoses Final diagnoses:  Abdominal pain, unspecified abdominal location  Enteritis    Rx / DC Orders ED Discharge Orders          Ordered    dicyclomine (BENTYL) 20 MG tablet  2 times daily        03/13/21 1923    omeprazole (PRILOSEC) 20 MG capsule  Daily        03/13/21 1923             Linwood Dibbles, MD 03/13/21 1925

## 2021-03-13 NOTE — Discharge Instructions (Addendum)
Take the medications as prescribed.  Follow-up with a GI doctor for further evaluation.  Schedule an appointment with the hematologist regarding your elevated red blood cell count

## 2021-03-14 ENCOUNTER — Telehealth: Payer: Self-pay | Admitting: Internal Medicine

## 2021-03-14 NOTE — Telephone Encounter (Signed)
New patient referral

## 2021-03-15 ENCOUNTER — Ambulatory Visit: Payer: BC Managed Care – PPO | Admitting: General Surgery

## 2021-03-21 NOTE — Progress Notes (Deleted)
Referring Provider:[facility] Primary Care Physician:  Elfredia Nevins, MD Primary Gastroenterologist:  Dr. Bonnetta Barry chief complaint on file.   HPI:   Jordan Ball is a 35 y.o. male presenting today at the request of [facility] emergency department for abdominal pain.  Patient was recently evaluated on 03/13/2021 at [facility] emergency department for abdominal pain.  He reported few days of more severe upper abdominal pain, primarily epigastric.  Worse with certain positions.  Associated loose stools.  Also reported history of ventral hernia which she had discussed with Dr. Lovell Sheehan with plans to follow-up in the future.  He is able to feel his hernia bulge out and in and at times.  Laboratory evaluation included CBC remarkable for WBC 11.8, hemoglobin 19.3, normocytic indices and normal platelets.  CMP remarkable for ALT elevated at 45.  Lipase normal.  UA negative.  CT A/P with small free fluid in the pelvis of uncertain source.  Suspicion of slightly thickened pelvic small bowel loops with mild mucosal enhancement that may be seen with infectious enteritis or inflammatory bowel disease.  Diverticular disease of the colon without wall thickening.  He was prescribed omeprazole 20 mg daily, dicyclomine 20 mg twice daily, and advised to follow-up with GI and hematology.  Notably, patient was evaluated by Dr. Lovell Sheehan on 9/15 for anal fistula.  Denied passing stool or air through fistula.  He had completed a course of antibiotics.  No family history of IBD.  Rectal exam with small fistulous opening at 6 o'clock position outside anal verge.  No induration.  Rectal exam did not reveal an opening in the rectum.  No purulent fluid or stool expressed from the opening.  Silver nitrate was applied to the opening and it did not track more than a few millimeters.  He was also given Rectiv samples for rectal pain.  Plan to follow-up in 2 weeks.  Today:    Elevated ALT:    No past medical history on  file.  Past Surgical History:  Procedure Laterality Date   IRRIGATION AND DEBRIDEMENT ABSCESS N/A 08/01/2019   Procedure: INCISION AND DRAINAGE OF RIGHT SUBMANDIBULAR SPACE ABSCESS AND EXTRACTION OF TEETH;  Surgeon: Lovena Neighbours, MD;  Location: [facility] OR;  Service: Plastics;  Laterality: N/A;   KNEE SURGERY Left    leftmiddle finger      Current Outpatient Medications  Medication Sig Dispense Refill   acetaminophen (TYLENOL) 500 MG tablet Take 500 mg by mouth every 6 (six) hours as needed for mild pain or moderate pain.     dicyclomine (BENTYL) 20 MG tablet Take 1 tablet (20 mg total) by mouth 2 (two) times daily. 20 tablet 0   omeprazole (PRILOSEC) 20 MG capsule Take 1 capsule (20 mg total) by mouth daily for 10 days. 10 capsule 0   No current facility-administered medications for this visit.    Allergies as of 03/22/2021   (No Known Allergies)    Family History  Problem Relation Age of Onset   Diabetes Other    Healthy Mother     Social History   Socioeconomic History   Marital status: Single    Spouse name: Not on file   Number of children: Not on file   Years of education: 12   Highest education level: Not on file  Occupational History   Not on file  Tobacco Use   Smoking status: Never   Smokeless tobacco: Never  Vaping Use   Vaping Use: Never used  Substance and Sexual Activity   Alcohol use: Yes    Comment: Occ   Drug use: No   Sexual activity: Not on file  Other Topics Concern   Not on file  Social History Narrative   Not on file   Social Determinants of Health   Financial Resource Strain: Not on file  Food Insecurity: Not on file  Transportation Needs: Not on file  Physical Activity: Not on file  Stress: Not on file  Social Connections: Not on file  Intimate Partner Violence: Not on file    Review of Systems: Gen: Denies any fever, chills, fatigue, weight loss, lack of appetite.  CV: Denies chest pain, heart palpitations, peripheral  edema, syncope.  Resp: Denies shortness of breath at rest or with exertion. Denies wheezing or cough.  GI: Denies dysphagia or odynophagia. Denies jaundice, hematemesis, fecal incontinence. GU : Denies urinary burning, urinary frequency, urinary hesitancy MS: Denies joint pain, muscle weakness, cramps, or limitation of movement.  Derm: Denies rash, itching, dry skin Psych: Denies depression, anxiety, memory loss, and confusion Heme: Denies bruising, bleeding, and enlarged lymph nodes.  Physical Exam: There were no vitals taken for this visit. General:   Alert and oriented. Pleasant and cooperative. Well-nourished and well-developed.  Head:  Normocephalic and atraumatic. Eyes:  Without icterus, sclera clear and conjunctiva pink.  Ears:  Normal auditory acuity. Nose:  No deformity, discharge,  or lesions. Mouth:  No deformity or lesions, oral mucosa pink.  Neck:  Supple, without mass or thyromegaly. Lungs:  Clear to auscultation bilaterally. No wheezes, rales, or rhonchi. No distress.  Heart:  S1, S2 present without murmurs appreciated.  Abdomen:  +BS, soft, non-tender and non-distended. No HSM noted. No guarding or rebound. No masses appreciated.  Rectal:  Deferred  Msk:  Symmetrical without gross deformities. Normal posture. Pulses:  Normal pulses noted. Extremities:  Without clubbing or edema. Neurologic:  Alert and  oriented x4;  grossly normal neurologically. Skin:  Intact without significant lesions or rashes. Cervical Nodes:  No significant cervical adenopathy. Psych:  Alert and cooperative. Normal mood and affect.

## 2021-03-22 ENCOUNTER — Ambulatory Visit: Payer: BC Managed Care – PPO | Admitting: Gastroenterology

## 2021-03-22 ENCOUNTER — Encounter: Payer: Self-pay | Admitting: Gastroenterology

## 2021-03-26 NOTE — Progress Notes (Signed)
[facility] [address] [place] [address]   CLINIC:  Medical Oncology/Hematology  Patient Care Team: Elfredia Nevins, MD as PCP - General (Internal Medicine) Doreatha Massed, MD as Medical Oncologist (Hematology)  CHIEF COMPLAINTS/PURPOSE OF CONSULTATION:  Evaluation of elevated hemoglobin and WBC  HISTORY OF PRESENTING ILLNESS:  Jordan Ball 35 y.o. male is here because of evaluation of elevated hemoglobin and WBC, at the request of the ED.  Today he reports feeling good, and he is accompanied by his mother. He presented to the ED on 09/27 for abdominal pain at which time he was prescribed Prilosec and bentyl. He reports itching on his legs bilaterally following hot showers which lasts for about 30 minutes. He denies changing colors of his fingertips, history of CVA, MI, and blood clots. He also denies fevers, chills, night sweats, and unintentional weight loss. He reports a full-body burning tingling sensation when lying down which on one occasion caused syncope.  He reports history of fistula and ventral hernia; they first appeared about 2 months ago. He currently drives tractor-trailers, and he previously worked at a [employer] where he reports possible chemical exposure. He denies smoking history. He denies family history of elevated blood counts. His maternal great grandmother had pancreatic cancer, his maternal great uncle had non hodgkin's lymphoma, his maternal great aunt had lung cancer, a maternal first cousin had unspecified cancer, and a maternal great uncle had gall bladder cancer.   MEDICAL HISTORY:  History reviewed. No pertinent past medical history.  SURGICAL HISTORY: Past Surgical History:  Procedure Laterality Date   IRRIGATION AND DEBRIDEMENT ABSCESS N/A 08/01/2019   Procedure: INCISION AND DRAINAGE OF RIGHT SUBMANDIBULAR SPACE ABSCESS AND EXTRACTION OF TEETH;  Surgeon: Lovena Neighbours, MD;  Location: [facility] OR;  Service:  Plastics;  Laterality: N/A;   KNEE SURGERY Left    leftmiddle finger      SOCIAL HISTORY: Social History   Socioeconomic History   Marital status: Single    Spouse name: Not on file   Number of children: Not on file   Years of education: 12   Highest education level: Not on file  Occupational History   Not on file  Tobacco Use   Smoking status: Never   Smokeless tobacco: Never  Vaping Use   Vaping Use: Never used  Substance and Sexual Activity   Alcohol use: Yes    Comment: Occ   Drug use: No   Sexual activity: Not Currently  Other Topics Concern   Not on file  Social History Narrative   Not on file   Social Determinants of Health   Financial Resource Strain: Not on file  Food Insecurity: Not on file  Transportation Needs: Not on file  Physical Activity: Not on file  Stress: Not on file  Social Connections: Not on file  Intimate Partner Violence: Not on file    FAMILY HISTORY: Family History  Problem Relation Age of Onset   Diabetes Other    Healthy Mother     ALLERGIES:  has No Known Allergies.  MEDICATIONS:  Current Outpatient Medications  Medication Sig Dispense Refill   acetaminophen (TYLENOL) 500 MG tablet Take 500 mg by mouth every 6 (six) hours as needed for mild pain or moderate pain.     dicyclomine (BENTYL) 20 MG tablet Take 1 tablet (20 mg total) by mouth 2 (two) times daily. 20 tablet 0   omeprazole (PRILOSEC) 20 MG capsule Take 1 capsule (20 mg total) by  mouth daily for 10 days. 10 capsule 0   No current facility-administered medications for this visit.    REVIEW OF SYSTEMS:   Review of Systems  Constitutional:  Negative for appetite change, fatigue, fever and unexpected weight change.  Gastrointestinal:  Positive for abdominal pain (4/10) and diarrhea.  Neurological:  Positive for dizziness.  All other systems reviewed and are negative.   PHYSICAL EXAMINATION: ECOG PERFORMANCE STATUS: 0 - Asymptomatic  Vitals:   03/27/21 0809   BP: 110/79  Pulse: 76  Resp: 17  Temp: (!) 97 F (36.1 C)  SpO2: 97%   Filed Weights   03/27/21 0809  Weight: 240 lb (108.9 kg)   Physical Exam Vitals reviewed.  Constitutional:      Appearance: Normal appearance. He is obese.  Cardiovascular:     Rate and Rhythm: Normal rate and regular rhythm.     Pulses: Normal pulses.     Heart sounds: Normal heart sounds.  Pulmonary:     Effort: Pulmonary effort is normal.     Breath sounds: Normal breath sounds.  Abdominal:     Palpations: Abdomen is soft. There is no hepatomegaly, splenomegaly or mass.     Tenderness: There is no abdominal tenderness.     Hernia: A hernia is present.  Lymphadenopathy:     Cervical: No cervical adenopathy.     Right cervical: No superficial, deep or posterior cervical adenopathy.    Left cervical: No superficial, deep or posterior cervical adenopathy.     Upper Body:     Right upper body: No supraclavicular, axillary or pectoral adenopathy.     Left upper body: No supraclavicular, axillary or pectoral adenopathy.  Neurological:     General: No focal deficit present.     Mental Status: He is alert and oriented to person, place, and time.  Psychiatric:        Mood and Affect: Mood normal.        Behavior: Behavior normal.     LABORATORY DATA:  I have reviewed the data as listed Recent Results (from the past 2160 hour(s))  Lipase, blood     Status: None   Collection Time: 03/13/21  2:48 PM  Result Value Ref Range   Lipase 24 11 - 51 U/L    Comment: Performed at [facility], [address], [place], [place] [address]  Comprehensive metabolic panel     Status: Abnormal   Collection Time: 03/13/21  2:48 PM  Result Value Ref Range   Sodium 137 135 - 145 mmol/L   Potassium 3.8 3.5 - 5.1 mmol/L   Chloride 105 98 - 111 mmol/L   CO2 23 22 - 32 mmol/L   Glucose, Bld 97 70 - 99 mg/dL    Comment: Glucose reference range applies only to samples taken after fasting for at least 8 hours.   BUN 15 6 -  20 mg/dL   Creatinine, Ser 6.78 0.61 - 1.24 mg/dL   Calcium 9.9 8.9 - 93.8 mg/dL   Total Protein 8.5 (H) 6.5 - 8.1 g/dL   Albumin 4.9 3.5 - 5.0 g/dL   AST 27 15 - 41 U/L   ALT 45 (H) 0 - 44 U/L   Alkaline Phosphatase 85 38 - 126 U/L   Total Bilirubin 0.7 0.3 - 1.2 mg/dL   GFR, Estimated >10 >17 mL/min    Comment: (NOTE) Calculated using the CKD-EPI Creatinine Equation (2021)    Anion gap 9 5 - 15    Comment: Performed at  [facility], [address], [place], [place] [address]  CBC     Status: Abnormal   Collection Time: 03/13/21  2:48 PM  Result Value Ref Range   WBC 11.8 (H) 4.0 - 10.5 K/uL   RBC 5.93 (H) 4.22 - 5.81 MIL/uL   Hemoglobin 19.3 (H) 13.0 - 17.0 g/dL   HCT 56.4 (H) 33.2 - 95.1 %   MCV 92.1 80.0 - 100.0 fL   MCH 32.5 26.0 - 34.0 pg   MCHC 35.3 30.0 - 36.0 g/dL   RDW 88.4 16.6 - 06.3 %   Platelets 326 150 - 400 K/uL   nRBC 0.0 0.0 - 0.2 %    Comment: Performed at [facility], [address] [place], [place] [address]  Urinalysis, Routine w reflex microscopic Urine, Clean Catch     Status: Abnormal   Collection Time: 03/13/21  6:31 PM  Result Value Ref Range   Color, Urine YELLOW YELLOW   APPearance CLEAR CLEAR   Specific Gravity, Urine >1.046 (H) 1.005 - 1.030   pH 5.0 5.0 - 8.0   Glucose, UA NEGATIVE NEGATIVE mg/dL   Hgb urine dipstick NEGATIVE NEGATIVE   Bilirubin Urine NEGATIVE NEGATIVE   Ketones, ur NEGATIVE NEGATIVE mg/dL   Protein, ur NEGATIVE NEGATIVE mg/dL   Nitrite NEGATIVE NEGATIVE   Leukocytes,Ua NEGATIVE NEGATIVE    Comment: Performed at [facility], [address] [place], [place] [address]    RADIOGRAPHIC STUDIES: I have personally reviewed the radiological images as listed and agreed with the findings in the report. CT ABDOMEN PELVIS W CONTRAST  Result Date: 03/13/2021 CLINICAL DATA:  Abdomen pain with diarrhea EXAM: CT ABDOMEN AND PELVIS WITH CONTRAST TECHNIQUE: Multidetector CT imaging of the abdomen and pelvis was performed using the  standard protocol following bolus administration of intravenous contrast. CONTRAST:  26mL OMNIPAQUE IOHEXOL 350 MG/ML SOLN COMPARISON:  None. FINDINGS: Lower chest: Lung bases demonstrate no acute consolidation or effusion. Normal cardiac size. 11 mm hypodense lesion with peripheral enhancement at the anterior dome of liver, series 2, image 6 likely represent small hemangioma. No calcified gallstone or biliary dilatation Hepatobiliary: No focal liver abnormality is seen. No gallstones, gallbladder wall thickening, or biliary dilatation. Pancreas: Unremarkable. No pancreatic ductal dilatation or surrounding inflammatory changes. Spleen: Normal in size without focal abnormality. Adrenals/Urinary Tract: Adrenal glands are unremarkable. Kidneys are normal, without renal calculi, focal lesion, or hydronephrosis. Bladder is unremarkable. Stomach/Bowel: Stomach nonenlarged. Diverticular disease of the colon without acute wall thickening. Negative appendix. Possible mild thickening of pelvic small bowel loops with mild mucosal enhancement, for example series 2, image 66 and series 2, image 71. Bowel does not appear dilated. Vascular/Lymphatic: No significant vascular findings are present. No enlarged abdominal or pelvic lymph nodes. Reproductive: Prostate is unremarkable. Other: No free air. Small free fluid in the pelvis. Small fat containing left inguinal hernia Musculoskeletal: No acute or significant osseous findings. IMPRESSION: 1. Small free fluid in the pelvis of uncertain source, no free air is seen. Suspicion of slightly thickened pelvic small bowel loops with mild mucosal enhancement as may be seen with infectious enteritis or inflammatory bowel disease. 2. Diverticular disease of the colon without acute wall thickening Electronically Signed   By: Jasmine Pang M.D.   On: 03/13/2021 17:29    ASSESSMENT:  Elevated hemoglobin: - Patient seen at the request of Dr. Sherwood Gambler for abnormal CBC.  Labs on 03/13/2021 with  hemoglobin 19.3, hematocrit 54 and elevated white count of 11.8.  Prior to that labs  on 05/23/2020 with hemoglobin 18.6 and hematocrit 53 and elevated RBC count. - He reports itching on the legs after taking hot shower lasting up to 30 minutes.  No erythromelalgia's.  No prior history of thrombosis.  No history of MI or CVA. - Denies any fevers, night sweats or weight loss.  Denies any testosterone supplements.  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  Previously he worked in a landfill.  He is a non-smoker. - Maternal great grandmother had pancreatic cancer.  1 great aunt had lung cancer.  Maternal great uncle had non-Hodgkin's lymphoma and maternal great uncle had gallbladder cancer.   PLAN:  Erythrocytosis: - I have reviewed his labs over the last several years. - He had elevated hemoglobin, RBC count and hematocrit since 05/23/2020.  He also had elevated white count on several different occasions, but he had infection from fistula at that time. - Would repeat CBC today.  If white count and hemoglobin still elevated, will check for myeloproliferative disorders with JAK2 [id number] testing and BCR/ABL.  We will also check serum erythropoietin level and LDH. - RTC 3 weeks for follow-up.    All questions were answered. The patient knows to call the clinic with any problems, questions or concerns.  Doreatha Massed, MD 03/27/21 8:27 AM  [facility] [phone]   I, Alda Ponder, am acting as a scribe for Dr. Doreatha Massed.  I, Doreatha Massed MD, have reviewed the above documentation for accuracy and completeness, and I agree with the above.

## 2021-03-27 ENCOUNTER — Inpatient Hospital Stay (HOSPITAL_COMMUNITY): Payer: BC Managed Care – PPO | Attending: Hematology | Admitting: Hematology

## 2021-03-27 ENCOUNTER — Inpatient Hospital Stay (HOSPITAL_COMMUNITY): Payer: BC Managed Care – PPO

## 2021-03-27 ENCOUNTER — Other Ambulatory Visit: Payer: Self-pay

## 2021-03-27 ENCOUNTER — Encounter (HOSPITAL_COMMUNITY): Payer: Self-pay | Admitting: Hematology

## 2021-03-27 DIAGNOSIS — Z800 Family history of malignant neoplasm of digestive organs: Secondary | ICD-10-CM | POA: Insufficient documentation

## 2021-03-27 DIAGNOSIS — Z801 Family history of malignant neoplasm of trachea, bronchus and lung: Secondary | ICD-10-CM | POA: Insufficient documentation

## 2021-03-27 DIAGNOSIS — Z807 Family history of other malignant neoplasms of lymphoid, hematopoietic and related tissues: Secondary | ICD-10-CM | POA: Insufficient documentation

## 2021-03-27 DIAGNOSIS — D751 Secondary polycythemia: Secondary | ICD-10-CM | POA: Diagnosis not present

## 2021-03-27 LAB — LACTATE DEHYDROGENASE: LDH: 122 U/L (ref 98–192)

## 2021-03-27 LAB — CBC WITH DIFFERENTIAL/PLATELET
Abs Immature Granulocytes: 0.03 10*3/uL (ref 0.00–0.07)
Basophils Absolute: 0.1 10*3/uL (ref 0.0–0.1)
Basophils Relative: 1 %
Eosinophils Absolute: 0.4 10*3/uL (ref 0.0–0.5)
Eosinophils Relative: 4 %
HCT: 48.9 % (ref 39.0–52.0)
Hemoglobin: 17.2 g/dL — ABNORMAL HIGH (ref 13.0–17.0)
Immature Granulocytes: 0 %
Lymphocytes Relative: 21 %
Lymphs Abs: 2.2 10*3/uL (ref 0.7–4.0)
MCH: 33 pg (ref 26.0–34.0)
MCHC: 35.2 g/dL (ref 30.0–36.0)
MCV: 93.9 fL (ref 80.0–100.0)
Monocytes Absolute: 1 10*3/uL (ref 0.1–1.0)
Monocytes Relative: 9 %
Neutro Abs: 7 10*3/uL (ref 1.7–7.7)
Neutrophils Relative %: 65 %
Platelets: 318 10*3/uL (ref 150–400)
RBC: 5.21 MIL/uL (ref 4.22–5.81)
RDW: 11.7 % (ref 11.5–15.5)
WBC: 10.8 10*3/uL — ABNORMAL HIGH (ref 4.0–10.5)
nRBC: 0 % (ref 0.0–0.2)

## 2021-03-27 NOTE — Patient Instructions (Signed)
[facility] at [facility] Discharge Instructions  You were seen and examined today by Dr. Ellin Saba. Dr. Ellin Saba is a hematologist, meaning that he specializes in blood disorders. Dr. Ellin Saba discussed your past medical history, family history of cancers/blood disorders, and the events that led to you being here today.  You were referred to Dr. Ellin Saba by the Emergency Department doctor due to elevated red blood cells and hemoglobin. Dr. Ellin Saba has recommended additional lab work today to recheck your red blood cels and hemoglobin and in an attempt to identify the cause of your elevated red blood cells and hemoglobin.   Follow-up as scheduled.   Thank you for choosing [facility] at [facility] to provide your oncology and hematology care.  To afford each patient quality time with our provider, please arrive at least 15 minutes before your scheduled appointment time.   If you have a lab appointment with the Cancer Center please come in thru the Main Entrance and check in at the main information desk.  You need to re-schedule your appointment should you arrive 10 or more minutes late.  We strive to give you quality time with our providers, and arriving late affects you and other patients whose appointments are after yours.  Also, if you no show three or more times for appointments you may be dismissed from the clinic at the providers discretion.     Again, thank you for choosing [facility].  Our hope is that these requests will decrease the amount of time that you wait before being seen by our physicians.       _____________________________________________________________  Should you have questions after your visit to [facility], please contact our office at [phone] and follow the prompts.  Our office hours are 8:00 a.m. and 4:30 p.m. Monday - Friday.  Please note that voicemails left after 4:00 p.m. may  not be returned until the following business day.  We are closed weekends and major holidays.  You do have access to a nurse 24-7, just call the main number to the clinic [phone] and do not press any options, hold on the line and a nurse will answer the phone.    For prescription refill requests, have your pharmacy contact our office and allow 72 hours.    Due to Covid, you will need to wear a mask upon entering the hospital. If you do not have a mask, a mask will be given to you at the Main Entrance upon arrival. For doctor visits, patients may have 1 support person age 11 or older with them. For treatment visits, patients can not have anyone with them due to social distancing guidelines and our immunocompromised population.

## 2021-03-28 LAB — ERYTHROPOIETIN: Erythropoietin: 5.5 m[IU]/mL (ref 2.6–18.5)

## 2021-03-30 LAB — BCR-ABL1 FISH
Cells Analyzed: 200
Cells Counted: 200

## 2021-04-03 ENCOUNTER — Other Ambulatory Visit: Payer: Self-pay

## 2021-04-03 ENCOUNTER — Ambulatory Visit (INDEPENDENT_AMBULATORY_CARE_PROVIDER_SITE_OTHER): Payer: BC Managed Care – PPO | Admitting: General Surgery

## 2021-04-03 ENCOUNTER — Encounter: Payer: Self-pay | Admitting: General Surgery

## 2021-04-03 ENCOUNTER — Encounter: Payer: Self-pay | Admitting: Family Medicine

## 2021-04-03 VITALS — BP 117/90 | HR 78 | Temp 98.2°F | Resp 14 | Ht 72.0 in | Wt 236.0 lb

## 2021-04-03 DIAGNOSIS — K603 Anal fistula: Secondary | ICD-10-CM

## 2021-04-03 LAB — CALR + JAK2 E12-15 + MPL (REFLEXED)

## 2021-04-03 LAB — JAK2 V617F, W REFLEX TO CALR/E12/MPL

## 2021-04-03 NOTE — H&P (Signed)
Jordan Ball is an 35 y.o. male.   Chief Complaint: Perianal fistula HPI: Patient is a 35 year old white male who I initially saw in September 2022 with a perianal fistula.  He denied any air or stool passing through the fistula.  It was initially treated with silver nitrate.  This did the patient well until recently when he had recurrence of bloody drainage from the fistula.  He now presents for a fistulotomy.  No past medical history on file.  Past Surgical History:  Procedure Laterality Date   IRRIGATION AND DEBRIDEMENT ABSCESS N/A 08/01/2019   Procedure: INCISION AND DRAINAGE OF RIGHT SUBMANDIBULAR SPACE ABSCESS AND EXTRACTION OF TEETH;  Surgeon: Lovena Neighbours, MD;  Location: [facility] OR;  Service: Plastics;  Laterality: N/A;   KNEE SURGERY Left    leftmiddle finger      Family History  Problem Relation Age of Onset   Diabetes Other    Healthy Mother    Social History:  reports that he has never smoked. He has never used smokeless tobacco. He reports current alcohol use. He reports that he does not use drugs.  Allergies: No Known Allergies  No medications prior to admission.    No results found for this or any previous visit (from the past 48 hour(s)). No results found.  Review of Systems  Constitutional: Negative.   HENT: Negative.    Eyes: Negative.   Respiratory: Negative.    Cardiovascular: Negative.   Gastrointestinal: Negative.   Endocrine: Negative.   Genitourinary: Negative.   Musculoskeletal: Negative.   Allergic/Immunologic: Negative.   Neurological: Negative.   Hematological: Negative.   Psychiatric/Behavioral: Negative.     There were no vitals taken for this visit. Physical Exam Vitals reviewed.  Constitutional:      Appearance: Normal appearance. He is normal weight. He is not ill-appearing.  HENT:     Head: Normocephalic and atraumatic.  Cardiovascular:     Rate and Rhythm: Normal rate and regular rhythm.     Heart sounds: Normal heart  sounds. No murmur heard.   No friction rub. No gallop.  Pulmonary:     Effort: Pulmonary effort is normal. No respiratory distress.     Breath sounds: Normal breath sounds. No stridor. No wheezing, rhonchi or rales.  Genitourinary:    Comments: Perianal fistula at the 6 o'clock position outside the anal verge.  Some induration is present.  No rectal opening appreciated.  No purulent drainage noted. Skin:    General: Skin is warm and dry.  Neurological:     Mental Status: He is alert and oriented to person, place, and time.     Assessment/Plan Perianal fistula Plan: Patient is scheduled for a fistulotomy on 04/20/2021.  The risks and benefits of the procedure including bleeding, infection, incontinence, and the possibility of recurrence of the fistula were fully explained to the patient, who gave informed consent.  Franky Macho, MD 04/03/2021, 3:57 PM

## 2021-04-03 NOTE — Progress Notes (Signed)
Subjective:     Jordan Ball  Patient here for follow-up of anal fistula.  Soon after I last saw him, he did have improvement and the drainage had stopped.  Recently, the drainage has recurred and is bloody in nature.  No stool or air of past through the fistula.  It is somewhat tender to touch. Objective:    BP 117/90   Pulse 78   Temp 98.2 F (36.8 C) (Other (Comment))   Resp 14   Ht 6' (1.829 m)   Wt 236 lb (107 kg)   SpO2 93%   BMI 32.01 kg/m   General:  alert, cooperative, and no distress  Rectal examination reveals a posterior midline fistulous opening with granulation tissue present.  Some induration is noted extending to the anal verge.  No purulence is seen.     Assessment:    Perianal fistula    Plan:   Patient is scheduled for an anal fistulotomy on 04/20/2021.  The risks and benefits of the procedure including bleeding, infection, incontinence, and the possibility of recurrence of the fistula were fully explained to the patient, who gave informed consent.

## 2021-04-16 NOTE — Progress Notes (Shared)
[facility] [address], [place] [address]   CLINIC:  Medical Oncology/Hematology  PCP:  Elfredia Nevins, MD [address] / [place] [address]  [phone]  REASON FOR VISIT:  Follow-up for ***  PRIOR THERAPY: ***  CURRENT THERAPY: ***  INTERVAL HISTORY:  Mr. Jordan Ball, a 35 y.o. male, returns for routine follow-up for his ***. Jordan Ball was last seen on {XX/XX/XXXX}.   REVIEW OF SYSTEMS:  Review of Systems - Oncology  PAST MEDICAL/SURGICAL HISTORY:  No past medical history on file. Past Surgical History:  Procedure Laterality Date   IRRIGATION AND DEBRIDEMENT ABSCESS N/A 08/01/2019   Procedure: INCISION AND DRAINAGE OF RIGHT SUBMANDIBULAR SPACE ABSCESS AND EXTRACTION OF TEETH;  Surgeon: Lovena Neighbours, MD;  Location: [facility] OR;  Service: Plastics;  Laterality: N/A;   KNEE SURGERY Left    leftmiddle finger      SOCIAL HISTORY:  Social History   Socioeconomic History   Marital status: Single    Spouse name: Not on file   Number of children: Not on file   Years of education: 12   Highest education level: Not on file  Occupational History   Not on file  Tobacco Use   Smoking status: Never   Smokeless tobacco: Never  Vaping Use   Vaping Use: Never used  Substance and Sexual Activity   Alcohol use: Yes    Comment: Occ   Drug use: No   Sexual activity: Not Currently  Other Topics Concern   Not on file  Social History Narrative   Not on file   Social Determinants of Health   Financial Resource Strain: Not on file  Food Insecurity: Not on file  Transportation Needs: Not on file  Physical Activity: Not on file  Stress: Not on file  Social Connections: Not on file  Intimate Partner Violence: Not on file    FAMILY HISTORY:  Family History  Problem Relation Age of Onset   Diabetes Other    Healthy Mother     CURRENT MEDICATIONS:  Current Outpatient Medications  Medication Sig Dispense Refill    acetaminophen (TYLENOL) 500 MG tablet Take 500 mg by mouth every 6 (six) hours as needed for mild pain or moderate pain.     dicyclomine (BENTYL) 20 MG tablet Take 1 tablet (20 mg total) by mouth 2 (two) times daily. 20 tablet 0   omeprazole (PRILOSEC) 20 MG capsule Take 1 capsule (20 mg total) by mouth daily for 10 days. 10 capsule 0   No current facility-administered medications for this visit.    ALLERGIES:  No Known Allergies  PHYSICAL EXAM:  Performance status (ECOG): {CHL ONC AC:1660630160}  There were no vitals filed for this visit. Wt Readings from Last 3 Encounters:  04/03/21 236 lb (107 kg)  03/27/21 240 lb (108.9 kg)  03/13/21 233 lb 9 oz (105.9 kg)   Physical Exam  LABORATORY DATA:  I have reviewed the labs as listed.  CBC Latest Ref Rng & Units 03/27/2021 03/13/2021 05/23/2020  WBC 4.0 - 10.5 K/uL 10.8(H) 11.8(H) 10.1  Hemoglobin 13.0 - 17.0 g/dL 17.2(H) 19.3(H) 18.6(H)  Hematocrit 39.0 - 52.0 % 48.9 54.6(H) 53.0(H)  Platelets 150 - 400 K/uL 318 326 359   CMP Latest Ref Rng & Units 03/13/2021 05/23/2020 08/04/2019  Glucose 70 - 99 mg/dL 97 96 109(N)  BUN 6 - 20 mg/dL 15 12 13   Creatinine 0.61 - 1.24 mg/dL 2.35 5.73  Sodium 135 - 145  mmol/L 137 141 140  Potassium 3.5 - 5.1 mmol/L 3.8 4.3 4.2  Chloride 98 - 111 mmol/L 105 105 101  CO2 22 - 32 mmol/L 23 19(L) 25  Calcium 8.9 - 10.3 mg/dL 9.9 9.9 9.3  Total Protein 6.5 - 8.1 g/dL 8.5(H) 7.4 -  Total Bilirubin 0.3 - 1.2 mg/dL 0.7 0.3 -  Alkaline Phos 38 - 126 U/L 85 86 -  AST 15 - 41 U/L 27 26 -  ALT 0 - 44 U/L 45(H) 39 -      Component Value Date/Time   RBC 5.21 03/27/2021 0906   MCV 93.9 03/27/2021 0906   MCV 91 05/23/2020 1143   MCH 33.0 03/27/2021 0906   MCHC 35.2 03/27/2021 0906   RDW 11.7 03/27/2021 0906   RDW 12.5 05/23/2020 1143   LYMPHSABS 2.2 03/27/2021 0906   LYMPHSABS 2.5 05/23/2020 1143   MONOABS 1.0 03/27/2021 0906   EOSABS 0.4 03/27/2021 0906   EOSABS 0.4 05/23/2020 1143   BASOSABS 0.1  03/27/2021 0906   BASOSABS 0.2 05/23/2020 1143    DIAGNOSTIC IMAGING:  I have independently reviewed the scans and discussed with the patient. No results found.   ASSESSMENT:  ***   PLAN:  ***  Orders placed this encounter:  No orders of the defined types were placed in this encounter.    Derek Jack, MD [facility] [phone]   I, ***, am acting as a scribe for Dr. Derek Jack.  {Add Barista Statement}

## 2021-04-17 ENCOUNTER — Ambulatory Visit (HOSPITAL_COMMUNITY): Payer: BC Managed Care – PPO | Admitting: Hematology

## 2021-04-18 NOTE — Patient Instructions (Signed)
Jordan Ball  04/18/2021     @PREFPERIOPPHARMACY @   Your procedure is scheduled on  04/20/2021.   Report to 13/09/2020 at  [phone] A.M.   Call this number if you have problems the morning of surgery:  [phone]   Remember:  Do not eat or drink after midnight.      Take these medicines the morning of surgery with A SIP OF WATER                        bentyl, omeprazole     Do not wear jewelry, make-up or nail polish.  Do not wear lotions, powders, or perfumes, or deodorant.  Do not shave 48 hours prior to surgery.  Men may shave face and neck.  Do not bring valuables to the hospital.  [facility] is not responsible for any belongings or valuables.  Contacts, dentures or bridgework may not be worn into surgery.  Leave your suitcase in the car.  After surgery it may be brought to your room.  For patients admitted to the hospital, discharge time will be determined by your treatment team.  Patients discharged the day of surgery will not be allowed to drive home and must have someone with them for 24 hours.    Special instructions:   DO NOT smoke tobacco or vape for 24 hours before your procedure.  Please read over the following fact sheets that you were given. Pain Booklet, Surgical Site Infection Prevention, Anesthesia Post-op Instructions, and Care and Recovery After Surgery      Anal Fistulotomy, Care After This sheet gives you information about how to care for yourself after your procedure. Your health care provider may also give you more specific instructions. If you have problems or questions, contact your health care provider. What can I expect after the procedure? After the procedure, it is common to have: Some pain, discomfort, and swelling. Increased pain during bowel movements. Some bleeding from the incision area. Some leakage of stool. Follow these instructions at home: Medicines Take over-the-counter and prescription medicines only as  told by your health care provider. If you were prescribed an antibiotic medicine, take it as told by your health care provider. Do not stop taking the antibiotic even if you start to feel better. Ask your health care provider if the medicine prescribed to you requires you to avoid driving or using heavy machinery. Incision care  Follow instructions from your health care provider about how to take care of your incision. Make sure you: Wash your hands with soap and water before and after you remove your gauze (dressing). If soap and water are not available, use hand sanitizer. Remove your dressing as told by your health care provider. In some cases, you may be told not to remove the dressing, but to allow it to come out with your first bowel movement after surgery. Leave stitches (sutures), skin glue, or adhesive strips in place. These skin closures may need to stay in place for 2 weeks or longer. Do not remove adhesive strips completely unless your health care provider tells you to do that. Keep the incision area clean and dry. Check your incision area every day for signs of infection. Check for: More redness, swelling, or pain. More fluid or blood. Warmth. Pus or a bad smell. Self-care After a bowel movement, clean the incision area using one of the following methods: Gently wipe with a moist  towelette. Gently wipe with mild soap and water. Take a shower. Take a sitz bath. This is a shallow, warm-water bath that attaches to the toilet bowl. You can also sit in a bathtub filled with warm water. Do not swim or use a hot tub until your health care provider approves. To reduce discomfort, you may apply ice to the incision area. To do this: Put ice in a plastic bag. Place a towel between your skin and the bag. Leave the ice on for 20 minutes, 2-3 times a day. Activity  Rest as told by your health care provider. Avoid sitting for a long time without moving. Get up to take short walks every  1-2 hours. This is important to improve blood flow and breathing. Ask for help if you feel weak or unsteady. Do not drive for 24 hours if you were given a sedative during your procedure. Do not lift anything that is heavier than 10 lb (4.5 kg), or the limit that you are told, until your health care provider says that it is safe. Return to your normal activities as told by your health care provider. Ask your health care provider what activities are safe for you. Eating and drinking Follow instructions from your health care provider about eating or drinking restrictions. You may need to take these actions to prevent or treat constipation: Drink enough fluid to keep your urine pale yellow. Eat foods that are high in fiber, such as beans, whole grains, and fresh fruits and vegetables. Limit foods that are high in fat and processed sugars, such as fried or sweet foods. Lifestyle Do not use any products that contain nicotine or tobacco, such as cigarettes, e-cigarettes, and chewing tobacco. If you need help quitting, ask your health care provider. Do not drink alcohol if: Your health care provider tells you not to drink. You are pregnant, may be pregnant, or are planning to become pregnant. If you drink alcohol: Limit how much you use to: 0-1 drink a day for women. 0-2 drinks a day for men. Be aware of how much alcohol is in your drink. In the [place] one drink equals one 12 oz bottle of beer (355 mL), one 5 oz glass of wine (148 mL), or one 1 oz glass of hard liquor (44 mL). General instructions If you have bleeding from the incision area, wear a pad to absorb blood. Change it often. Keep all follow-up visits as told by your health care provider. This is important. Contact a health care provider if: You have any of these signs of infection: More redness, swelling, or pain around your incision area. Warmth coming from your incision area, or your incision area is firm. Pus or a bad smell coming  from your incision area. A fever or chills. You develop swelling or tenderness in your groin area. You cannot control your bowel movements (incontinence), or you are leaking stool. You have trouble urinating. You have pain that does not get better with medicine. Get help right away if: You have severe pain in your abdomen or incision area. You have sudden chest pain. You become weak or you faint. You have more fluid or blood coming from your incision. You have bleeding from your incision that soaks 2 or more pads during 24 hours. Summary After anal fistulotomy, it is common to have increased pain during bowel movements and some bleeding. If a dressing was placed in your incision during surgery, remove it as told by your health care provider. It is important  to keep the incision area clean and dry after each bowel movement. If you have bleeding from the incision area, wear a pad to absorb blood. Change it often. This information is not intended to replace advice given to you by your health care provider. Make sure you discuss any questions you have with your health care provider. Document Revised: 11/16/2018 Document Reviewed: 11/16/2018 Elsevier Patient Education  2022 Elsevier Inc[employer] General Anesthesia, Adult, Care After This sheet gives you information about how to care for yourself after your procedure. Your health care provider may also give you more specific instructions. If you have problems or questions, contact your health care provider. What can I expect after the procedure? After the procedure, the following side effects are common: Pain or discomfort at the IV site. Nausea. Vomiting. Sore throat. Trouble concentrating. Feeling cold or chills. Feeling weak or tired. Sleepiness and fatigue. Soreness and body aches. These side effects can affect parts of the body that were not involved in surgery. Follow these instructions at home: For the time period you were told by your  health care provider:  Rest. Do not participate in activities where you could fall or become injured. Do not drive or use machinery. Do not drink alcohol. Do not take sleeping pills or medicines that cause drowsiness. Do not make important decisions or sign legal documents. Do not take care of children on your own. Eating and drinking Follow any instructions from your health care provider about eating or drinking restrictions. When you feel hungry, start by eating small amounts of foods that are soft and easy to digest (bland), such as toast. Gradually return to your regular diet. Drink enough fluid to keep your urine pale yellow. If you vomit, rehydrate by drinking water, juice, or clear broth. General instructions If you have sleep apnea, surgery and certain medicines can increase your risk for breathing problems. Follow instructions from your health care provider about wearing your sleep device: Anytime you are sleeping, including during daytime naps. While taking prescription pain medicines, sleeping medicines, or medicines that make you drowsy. Have a responsible adult stay with you for the time you are told. It is important to have someone help care for you until you are awake and alert. Return to your normal activities as told by your health care provider. Ask your health care provider what activities are safe for you. Take over-the-counter and prescription medicines only as told by your health care provider. If you smoke, do not smoke without supervision. Keep all follow-up visits as told by your health care provider. This is important. Contact a health care provider if: You have nausea or vomiting that does not get better with medicine. You cannot eat or drink without vomiting. You have pain that does not get better with medicine. You are unable to pass urine. You develop a skin rash. You have a fever. You have redness around your IV site that gets worse. Get help right away  if: You have difficulty breathing. You have chest pain. You have blood in your urine or stool, or you vomit blood. Summary After the procedure, it is common to have a sore throat or nausea. It is also common to feel tired. Have a responsible adult stay with you for the time you are told. It is important to have someone help care for you until you are awake and alert. When you feel hungry, start by eating small amounts of foods that are soft and easy to digest (bland), such as  toast. Gradually return to your regular diet. Drink enough fluid to keep your urine pale yellow. Return to your normal activities as told by your health care provider. Ask your health care provider what activities are safe for you. This information is not intended to replace advice given to you by your health care provider. Make sure you discuss any questions you have with your health care provider. Document Revised: 02/17/2020 Document Reviewed: 09/16/2019 Elsevier Patient Education  2022 Elsevier Inc. How to Use Chlorhexidine for Bathing Chlorhexidine gluconate (CHG) is a germ-killing (antiseptic) solution that is used to clean the skin. It can get rid of the bacteria that normally live on the skin and can keep them away for about 24 hours. To clean your skin with CHG, you may be given: A CHG solution to use in the shower or as part of a sponge bath. A prepackaged cloth that contains CHG. Cleaning your skin with CHG may help lower the risk for infection: While you are staying in the intensive care unit of the hospital. If you have a vascular access, such as a central line, to provide short-term or long-term access to your veins. If you have a catheter to drain urine from your bladder. If you are on a ventilator. A ventilator is a machine that helps you breathe by moving air in and out of your lungs. After surgery. What are the risks? Risks of using CHG include: A skin reaction. Hearing loss, if CHG gets in your ears  and you have a perforated eardrum. Eye injury, if CHG gets in your eyes and is not rinsed out. The CHG product catching fire. Make sure that you avoid smoking and flames after applying CHG to your skin. Do not use CHG: If you have a chlorhexidine allergy or have previously reacted to chlorhexidine. On babies younger than 57 months of age. How to use CHG solution Use CHG only as told by your health care provider, and follow the instructions on the label. Use the full amount of CHG as directed. Usually, this is one bottle. During a shower Follow these steps when using CHG solution during a shower (unless your health care provider gives you different instructions): Start the shower. Use your normal soap and shampoo to wash your face and hair. Turn off the shower or move out of the shower stream. Pour the CHG onto a clean washcloth. Do not use any type of brush or rough-edged sponge. Starting at your neck, lather your body down to your toes. Make sure you follow these instructions: If you will be having surgery, pay special attention to the part of your body where you will be having surgery. Scrub this area for at least 1 minute. Do not use CHG on your head or face. If the solution gets into your ears or eyes, rinse them well with water. Avoid your genital area. Avoid any areas of skin that have broken skin, cuts, or scrapes. Scrub your back and under your arms. Make sure to wash skin folds. Let the lather sit on your skin for 1-2 minutes or as long as told by your health care provider. Thoroughly rinse your entire body in the shower. Make sure that all body creases and crevices are rinsed well. Dry off with a clean towel. Do not put any substances on your body afterward--such as powder, lotion, or perfume--unless you are told to do so by your health care provider. Only use lotions that are recommended by the manufacturer. Put on clean clothes  or pajamas. If it is the night before your surgery,  sleep in clean sheets.  During a sponge bath Follow these steps when using CHG solution during a sponge bath (unless your health care provider gives you different instructions): Use your normal soap and shampoo to wash your face and hair. Pour the CHG onto a clean washcloth. Starting at your neck, lather your body down to your toes. Make sure you follow these instructions: If you will be having surgery, pay special attention to the part of your body where you will be having surgery. Scrub this area for at least 1 minute. Do not use CHG on your head or face. If the solution gets into your ears or eyes, rinse them well with water. Avoid your genital area. Avoid any areas of skin that have broken skin, cuts, or scrapes. Scrub your back and under your arms. Make sure to wash skin folds. Let the lather sit on your skin for 1-2 minutes or as long as told by your health care provider. Using a different clean, wet washcloth, thoroughly rinse your entire body. Make sure that all body creases and crevices are rinsed well. Dry off with a clean towel. Do not put any substances on your body afterward--such as powder, lotion, or perfume--unless you are told to do so by your health care provider. Only use lotions that are recommended by the manufacturer. Put on clean clothes or pajamas. If it is the night before your surgery, sleep in clean sheets. How to use CHG prepackaged cloths Only use CHG cloths as told by your health care provider, and follow the instructions on the label. Use the CHG cloth on clean, dry skin. Do not use the CHG cloth on your head or face unless your health care provider tells you to. When washing with the CHG cloth: Avoid your genital area. Avoid any areas of skin that have broken skin, cuts, or scrapes. Before surgery Follow these steps when using a CHG cloth to clean before surgery (unless your health care provider gives you different instructions): Using the CHG cloth,  vigorously scrub the part of your body where you will be having surgery. Scrub using a back-and-forth motion for 3 minutes. The area on your body should be completely wet with CHG when you are done scrubbing. Do not rinse. Discard the cloth and let the area air-dry. Do not put any substances on the area afterward, such as powder, lotion, or perfume. Put on clean clothes or pajamas. If it is the night before your surgery, sleep in clean sheets.  For general bathing Follow these steps when using CHG cloths for general bathing (unless your health care provider gives you different instructions). Use a separate CHG cloth for each area of your body. Make sure you wash between any folds of skin and between your fingers and toes. Wash your body in the following order, switching to a new cloth after each step: The front of your neck, shoulders, and chest. Both of your arms, under your arms, and your hands. Your stomach and groin area, avoiding the genitals. Your right leg and foot. Your left leg and foot. The back of your neck, your back, and your buttocks. Do not rinse. Discard the cloth and let the area air-dry. Do not put any substances on your body afterward--such as powder, lotion, or perfume--unless you are told to do so by your health care provider. Only use lotions that are recommended by the manufacturer. Put on clean clothes or pajamas.  Contact a health care provider if: Your skin gets irritated after scrubbing. You have questions about using your solution or cloth. You swallow any chlorhexidine. Call your local poison control center ([phone] in the [place] Get help right away if: Your eyes itch badly, or they become very red or swollen. Your skin itches badly and is red or swollen. Your hearing changes. You have trouble seeing. You have swelling or tingling in your mouth or throat. You have trouble breathing. These symptoms may represent a serious problem that is an emergency. Do  not wait to see if the symptoms will go away. Get medical help right away. Call your local emergency services (911 in the [place] Do not drive yourself to the hospital. Summary Chlorhexidine gluconate (CHG) is a germ-killing (antiseptic) solution that is used to clean the skin. Cleaning your skin with CHG may help to lower your risk for infection. You may be given CHG to use for bathing. It may be in a bottle or in a prepackaged cloth to use on your skin. Carefully follow your health care provider's instructions and the instructions on the product label. Do not use CHG if you have a chlorhexidine allergy. Contact your health care provider if your skin gets irritated after scrubbing. This information is not intended to replace advice given to you by your health care provider. Make sure you discuss any questions you have with your health care provider. Document Revised: 08/14/2020 Document Reviewed: 08/14/2020 Elsevier Patient Education  2022 [employer].

## 2021-04-19 ENCOUNTER — Other Ambulatory Visit: Payer: Self-pay

## 2021-04-19 ENCOUNTER — Encounter (HOSPITAL_COMMUNITY)
Admission: RE | Admit: 2021-04-19 | Discharge: 2021-04-19 | Disposition: A | Discharge status: Home / Self Care | Payer: BC Managed Care – PPO | Source: Ambulatory Visit | Attending: General Surgery | Admitting: General Surgery

## 2021-04-19 ENCOUNTER — Encounter (HOSPITAL_COMMUNITY): Payer: Self-pay

## 2021-04-19 HISTORY — DX: Gastro-esophageal reflux disease without esophagitis: K21.9

## 2021-04-20 ENCOUNTER — Encounter (HOSPITAL_COMMUNITY): Payer: Self-pay | Admitting: General Surgery

## 2021-04-20 ENCOUNTER — Ambulatory Visit (HOSPITAL_COMMUNITY): Payer: BC Managed Care – PPO | Admitting: Certified Registered"

## 2021-04-20 ENCOUNTER — Ambulatory Visit (HOSPITAL_COMMUNITY)
Admission: RE | Admit: 2021-04-20 | Discharge: 2021-04-20 | Disposition: A | Discharge status: Home / Self Care | Payer: BC Managed Care – PPO | Attending: General Surgery | Admitting: General Surgery

## 2021-04-20 ENCOUNTER — Encounter (HOSPITAL_COMMUNITY)
Admission: RE | Disposition: A | Discharge status: Home / Self Care | Payer: Self-pay | Source: Home / Self Care | Attending: General Surgery

## 2021-04-20 DIAGNOSIS — K603 Anal fistula: Secondary | ICD-10-CM | POA: Diagnosis not present

## 2021-04-20 DIAGNOSIS — K648 Other hemorrhoids: Secondary | ICD-10-CM | POA: Insufficient documentation

## 2021-04-20 HISTORY — PX: ANAL FISTULOTOMY: SHX6423

## 2021-04-20 SURGERY — ANAL FISTULOTOMY
Anesthesia: General | Site: Anus

## 2021-04-20 MED ORDER — ONDANSETRON HCL 4 MG/2ML IJ SOLN: 4.0000 mg | Freq: Once | INTRAMUSCULAR | Status: DC | PRN

## 2021-04-20 MED ORDER — SODIUM CHLORIDE 0.9 % IR SOLN
Status: DC | PRN
  Administered 2021-04-20: 1000 mL

## 2021-04-20 MED ORDER — OXYCODONE-ACETAMINOPHEN 5-325 MG PO TABS: 1.0000 | ORAL_TABLET | ORAL | 0 refills | Status: DC | PRN

## 2021-04-20 MED ORDER — FENTANYL CITRATE (PF) 100 MCG/2ML IJ SOLN
INTRAMUSCULAR | Status: AC
  Filled 2021-04-20: qty 2

## 2021-04-20 MED ORDER — ORAL CARE MOUTH RINSE: 15.0000 mL | Freq: Once | OROMUCOSAL | Status: AC

## 2021-04-20 MED ORDER — GLYCOPYRROLATE PF 0.2 MG/ML IJ SOSY
PREFILLED_SYRINGE | INTRAMUSCULAR | Status: AC
  Filled 2021-04-20: qty 1

## 2021-04-20 MED ORDER — MIDAZOLAM HCL 2 MG/2ML IJ SOLN
INTRAMUSCULAR | Status: AC
  Filled 2021-04-20: qty 2

## 2021-04-20 MED ORDER — EPHEDRINE SULFATE 50 MG/ML IJ SOLN
INTRAMUSCULAR | Status: DC | PRN
  Administered 2021-04-20: 5 mg via INTRAVENOUS

## 2021-04-20 MED ORDER — CHLORHEXIDINE GLUCONATE CLOTH 2 % EX PADS: 6.0000 | MEDICATED_PAD | Freq: Once | CUTANEOUS | Status: DC

## 2021-04-20 MED ORDER — BUPIVACAINE LIPOSOME 1.3 % IJ SUSP
INTRAMUSCULAR | Status: DC | PRN
  Administered 2021-04-20: 15 mL

## 2021-04-20 MED ORDER — GLYCOPYRROLATE PF 0.2 MG/ML IJ SOSY
PREFILLED_SYRINGE | INTRAMUSCULAR | Status: DC | PRN
  Administered 2021-04-20: .1 mg via INTRAVENOUS

## 2021-04-20 MED ORDER — LIDOCAINE HCL (CARDIAC) PF 100 MG/5ML IV SOSY
PREFILLED_SYRINGE | INTRAVENOUS | Status: DC | PRN
Start: 2021-04-20 — End: 2021-04-20
  Administered 2021-04-20: 100 mg via INTRAVENOUS

## 2021-04-20 MED ORDER — ONDANSETRON HCL 4 MG/2ML IJ SOLN
INTRAMUSCULAR | Status: DC | PRN
  Administered 2021-04-20: 4 mg via INTRAVENOUS

## 2021-04-20 MED ORDER — DEXAMETHASONE SODIUM PHOSPHATE 4 MG/ML IJ SOLN
INTRAMUSCULAR | Status: DC | PRN
  Administered 2021-04-20: 8 mg via INTRAVENOUS

## 2021-04-20 MED ORDER — KETOROLAC TROMETHAMINE 30 MG/ML IJ SOLN: 30.0000 mg | Freq: Once | INTRAMUSCULAR | Status: DC

## 2021-04-20 MED ORDER — ONDANSETRON HCL 4 MG/2ML IJ SOLN
INTRAMUSCULAR | Status: AC
  Filled 2021-04-20: qty 2

## 2021-04-20 MED ORDER — DEXAMETHASONE SODIUM PHOSPHATE 10 MG/ML IJ SOLN
INTRAMUSCULAR | Status: AC
  Filled 2021-04-20: qty 1

## 2021-04-20 MED ORDER — PROPOFOL 10 MG/ML IV BOLUS
INTRAVENOUS | Status: DC | PRN
  Administered 2021-04-20: 200 mg via INTRAVENOUS

## 2021-04-20 MED ORDER — LIDOCAINE HCL (PF) 2 % IJ SOLN
INTRAMUSCULAR | Status: AC
  Filled 2021-04-20: qty 5

## 2021-04-20 MED ORDER — LACTATED RINGERS IV SOLN: INTRAVENOUS | Status: DC

## 2021-04-20 MED ORDER — FENTANYL CITRATE (PF) 100 MCG/2ML IJ SOLN
INTRAMUSCULAR | Status: DC | PRN
  Administered 2021-04-20 (×4): 50 ug via INTRAVENOUS

## 2021-04-20 MED ORDER — SODIUM CHLORIDE 0.9 % IV SOLN
2.0000 g | INTRAVENOUS | Status: AC
  Administered 2021-04-20: 2 g via INTRAVENOUS
  Filled 2021-04-20: qty 2

## 2021-04-20 MED ORDER — FENTANYL CITRATE PF 50 MCG/ML IJ SOSY: 25.0000 ug | PREFILLED_SYRINGE | INTRAMUSCULAR | Status: DC | PRN

## 2021-04-20 MED ORDER — PROPOFOL 10 MG/ML IV BOLUS
INTRAVENOUS | Status: AC
  Filled 2021-04-20: qty 20

## 2021-04-20 MED ORDER — BUPIVACAINE LIPOSOME 1.3 % IJ SUSP
INTRAMUSCULAR | Status: AC
  Filled 2021-04-20: qty 20

## 2021-04-20 MED ORDER — EPHEDRINE 5 MG/ML INJ
INTRAVENOUS | Status: AC
  Filled 2021-04-20: qty 5

## 2021-04-20 MED ORDER — MIDAZOLAM HCL 5 MG/5ML IJ SOLN
INTRAMUSCULAR | Status: DC | PRN
  Administered 2021-04-20: 2 mg via INTRAVENOUS

## 2021-04-20 MED ORDER — LIDOCAINE VISCOUS HCL 2 % MT SOLN
OROMUCOSAL | Status: AC
  Filled 2021-04-20: qty 15

## 2021-04-20 MED ORDER — CHLORHEXIDINE GLUCONATE 0.12 % MT SOLN
15.0000 mL | Freq: Once | OROMUCOSAL | Status: AC
  Administered 2021-04-20: 15 mL via OROMUCOSAL

## 2021-04-20 MED ORDER — CHLORHEXIDINE GLUCONATE 0.12 % MT SOLN
OROMUCOSAL | Status: AC
  Filled 2021-04-20: qty 15

## 2021-04-20 SURGICAL SUPPLY — 29 items
ADH SKN CLS APL DERMABOND .7 (GAUZE/BANDAGES/DRESSINGS) ×1
BAG HAMPER (MISCELLANEOUS) ×2 IMPLANT
CLOTH BEACON ORANGE TIMEOUT ST (SAFETY) ×2 IMPLANT
COVER LIGHT HANDLE STERIS (MISCELLANEOUS) ×4 IMPLANT
DERMABOND ADVANCED (GAUZE/BANDAGES/DRESSINGS) ×1
DERMABOND ADVANCED .7 DNX12 (GAUZE/BANDAGES/DRESSINGS) IMPLANT
DRAPE HALF SHEET 40X57 (DRAPES) ×2 IMPLANT
ELECT REM PT RETURN 9FT ADLT (ELECTROSURGICAL) ×2
ELECTRODE REM PT RTRN 9FT ADLT (ELECTROSURGICAL) ×1 IMPLANT
GAUZE 4X4 16PLY ~~LOC~~+RFID DBL (SPONGE) ×1 IMPLANT
GAUZE SPONGE 4X4 12PLY STRL (GAUZE/BANDAGES/DRESSINGS) ×2 IMPLANT
GLOVE SURG POLYISO LF SZ7.5 (GLOVE) ×2 IMPLANT
GLOVE SURG UNDER POLY LF SZ7 (GLOVE) ×5 IMPLANT
GOWN STRL REUS W/TWL LRG LVL3 (GOWN DISPOSABLE) ×5 IMPLANT
HEMOSTAT SURGICEL 4X8 (HEMOSTASIS) ×2 IMPLANT
KIT TURNOVER CYSTO (KITS) ×2 IMPLANT
MANIFOLD NEPTUNE II (INSTRUMENTS) ×2 IMPLANT
NDL HYPO 18GX1.5 BLUNT FILL (NEEDLE) ×1 IMPLANT
NDL HYPO 21X1.5 SAFETY (NEEDLE) ×1 IMPLANT
NEEDLE HYPO 18GX1.5 BLUNT FILL (NEEDLE) ×2 IMPLANT
NEEDLE HYPO 21X1.5 SAFETY (NEEDLE) ×2 IMPLANT
NS IRRIG 1000ML POUR BTL (IV SOLUTION) ×2 IMPLANT
PACK PERI GYN (CUSTOM PROCEDURE TRAY) ×2 IMPLANT
PAD ARMBOARD 7.5X6 YLW CONV (MISCELLANEOUS) ×2 IMPLANT
SET BASIN LINEN APH (SET/KITS/TRAYS/PACK) ×2 IMPLANT
SURGILUBE 2OZ TUBE FLIPTOP (MISCELLANEOUS) ×2 IMPLANT
SUT MNCRL AB 4-0 PS2 18 (SUTURE) ×1 IMPLANT
SUT SILK 0 FSL (SUTURE) ×2 IMPLANT
SYR 20ML LL LF (SYRINGE) ×4 IMPLANT

## 2021-04-20 NOTE — Anesthesia Procedure Notes (Signed)
Procedure Name: LMA Insertion Date/Time: 04/20/2021 9:09 AM Performed by: Brynda Peon, CRNA Pre-anesthesia Checklist: Patient identified, Emergency Drugs available, Suction available, Patient being monitored and Timeout performed Patient Re-evaluated:Patient Re-evaluated prior to induction Oxygen Delivery Method: Circle system utilized Preoxygenation: Pre-oxygenation with 100% oxygen Induction Type: IV induction LMA: LMA inserted LMA Size: 5.0 Number of attempts: 1 Placement Confirmation: positive ETCO2, CO2 detector and breath sounds checked- equal and bilateral Tube secured with: Tape Dental Injury: Teeth and Oropharynx as per pre-operative assessment

## 2021-04-20 NOTE — Interval H&P Note (Signed)
History and Physical Interval Note:  04/20/2021 7:14 AM  [name]  has presented today for surgery, with the diagnosis of Anal Fistula.  The various methods of treatment have been discussed with the patient and family. After consideration of risks, benefits and other options for treatment, the patient has consented to  Procedure(s): ANAL FISTULOTOMY (N/A) as a surgical intervention.  The patient's history has been reviewed, patient examined, no change in status, stable for surgery.  I have reviewed the patient's chart and labs.  Questions were answered to the patient's satisfaction.     Jordan Ball

## 2021-04-20 NOTE — Anesthesia Postprocedure Evaluation (Signed)
Anesthesia Post Note  Patient: Jordan Ball  Procedure(s) Performed: ANAL FISTULOTOMY (Anus)  Patient location during evaluation: Phase II Anesthesia Type: General Level of consciousness: awake Pain management: pain level controlled Vital Signs Assessment: post-procedure vital signs reviewed and stable Respiratory status: spontaneous breathing and respiratory function stable Cardiovascular status: blood pressure returned to baseline and stable Postop Assessment: no headache and no apparent nausea or vomiting Anesthetic complications: no Comments: Late entry   No notable events documented.   Last Vitals:  Vitals:   04/20/21 1000 04/20/21 1016  BP: 99/69 119/87  Pulse: 97 86  Resp: 18 16  Temp:  36.4 C  SpO2: 97% 97%    Last Pain:  Vitals:   04/20/21 1016  TempSrc: Oral  PainSc: 0-No pain                 Windell Norfolk

## 2021-04-20 NOTE — Transfer of Care (Signed)
Immediate Anesthesia Transfer of Care Note  Patient: Jordan Ball  Procedure(s) Performed: ANAL FISTULOTOMY (Anus)  Patient Location: PACU  Anesthesia Type:General  Level of Consciousness: awake, alert , oriented, patient cooperative and responds to stimulation  Airway & Oxygen Therapy: Patient Spontanous Breathing  Post-op Assessment: Report given to RN, Post -op Vital signs reviewed and stable and Patient moving all extremities X 4  Post vital signs: Reviewed and stable  Last Vitals:  Vitals Value Taken Time  BP    Temp    Pulse    Resp    SpO2      Last Pain:  Vitals:   04/20/21 0719  TempSrc:   PainSc: 0-No pain      Patients Stated Pain Goal: 7 (04/20/21 0719)  Complications: No notable events documented.

## 2021-04-20 NOTE — Anesthesia Preprocedure Evaluation (Signed)
Anesthesia Evaluation  Patient identified by MRN, date of birth, ID band Patient awake    Reviewed: Allergy & Precautions, H&P , NPO status , Patient's Chart, lab work & pertinent test results, reviewed documented beta blocker date and time   Airway Mallampati: II  TM Distance: >3 FB Neck ROM: full    Dental no notable dental hx.    Pulmonary neg pulmonary ROS,    Pulmonary exam normal breath sounds clear to auscultation       Cardiovascular Exercise Tolerance: Good negative cardio ROS   Rhythm:regular Rate:Normal     Neuro/Psych negative neurological ROS  negative psych ROS   GI/Hepatic Neg liver ROS, GERD  Medicated,  Endo/Other  negative endocrine ROS  Renal/GU negative Renal ROS  negative genitourinary   Musculoskeletal   Abdominal   Peds  Hematology negative hematology ROS (+)   Anesthesia Other Findings   Reproductive/Obstetrics negative OB ROS                             Anesthesia Physical Anesthesia Plan  ASA: 2  Anesthesia Plan: General   Post-op Pain Management:    Induction:   PONV Risk Score and Plan: Propofol infusion  Airway Management Planned:   Additional Equipment:   Intra-op Plan:   Post-operative Plan:   Informed Consent: I have reviewed the patients History and Physical, chart, labs and discussed the procedure including the risks, benefits and alternatives for the proposed anesthesia with the patient or authorized representative who has indicated his/her understanding and acceptance.     Dental Advisory Given  Plan Discussed with: CRNA  Anesthesia Plan Comments:         Anesthesia Quick Evaluation  

## 2021-04-20 NOTE — Op Note (Signed)
Patient:  Jordan Ball  DOB:  July 12, 1985  MRN:  [id number]   Preop Diagnosis: Perianal fistula  Postop Diagnosis: Same  Procedure: Fistulectomy  Surgeon: Franky Macho, MD  Anes: General  Indications: Patient is a 35 year old white male who presents with a recurrent draining fistula at the 6 o'clock position outside the anal verge.  The risks and benefits of the procedure including bleeding, infection, and recurrence of the fistula were fully explained to the patient, who gave informed consent.  Procedure note: The patient was placed in the supine position.  After general anesthesia was administered, the perineum was prepped and draped using the usual sterile technique with Betadine.  Surgical site confirmation was performed.  On rectal examination, the patient did have a small internal hemorrhoid at the 11 o'clock position.  A fistulous opening was noted greater than 1 cm from the anal verge at the 6 o'clock position.  I did probe this and this did go subcutaneously towards the rectum, but I was not able to find a complete tunnel to the dentate line.  I suspect that this had resolved and that was just granulomatous tissue and the remaining fistulous tract to the skin.  The fistula opening was expanded and the granulomatous tissue was cauterized using Bovie electrocautery.  Exparel was instilled into the surrounding wound.  A 4-0 Monocryl subcuticular suture was then placed.  An external Monocryl suture was then placed.  The wound was covered with Dermabond.  All tape and needle counts were correct at the end the procedure.  The patient was awakened and transferred to PACU in stable condition.  Complications: None  EBL: Minimal  Specimen: None

## 2021-04-24 ENCOUNTER — Encounter (HOSPITAL_COMMUNITY): Payer: Self-pay | Admitting: General Surgery

## 2021-04-26 ENCOUNTER — Encounter: Payer: Self-pay | Admitting: General Surgery

## 2021-04-26 ENCOUNTER — Other Ambulatory Visit: Payer: Self-pay

## 2021-04-26 ENCOUNTER — Ambulatory Visit (INDEPENDENT_AMBULATORY_CARE_PROVIDER_SITE_OTHER): Payer: BC Managed Care – PPO | Admitting: General Surgery

## 2021-04-26 VITALS — BP 122/80 | HR 77 | Temp 97.5°F | Resp 16 | Ht 72.0 in | Wt 233.0 lb

## 2021-04-26 DIAGNOSIS — Z09 Encounter for follow-up examination after completed treatment for conditions other than malignant neoplasm: Secondary | ICD-10-CM

## 2021-04-26 NOTE — Progress Notes (Signed)
Subjective:     Jordan Ball  Here for postoperative visit, status post fistulectomy.  Patient is doing well.  He has no complaints. Objective:    BP 122/80   Pulse 77   Temp (!) 97.5 F (36.4 C) (Other (Comment))   Resp 16   Ht 6' (1.829 m)   Wt 233 lb (105.7 kg)   SpO2 95%   BMI 31.60 kg/m   General:  alert, cooperative, and no distress  Wound healing well by secondary intention with minimal wound separation, which was expected.     Assessment:    Doing well postoperatively.    Plan:   Follow-up here as needed.

## 2021-06-22 ENCOUNTER — Telehealth: Payer: Self-pay | Admitting: *Deleted

## 2021-06-22 NOTE — Telephone Encounter (Signed)
Received call from patient [phone]~ telephone.   Surgical Date: 04/20/2021 Procedure: Anal Fistulotomy  Concerns: Patient reports increase in pain and blood tinged discharge. Requested appointment for evaluation. Appointment scheduled.

## 2021-06-26 ENCOUNTER — Ambulatory Visit (INDEPENDENT_AMBULATORY_CARE_PROVIDER_SITE_OTHER): Payer: BC Managed Care – PPO | Admitting: General Surgery

## 2021-06-26 ENCOUNTER — Other Ambulatory Visit: Payer: Self-pay

## 2021-06-26 ENCOUNTER — Encounter: Payer: Self-pay | Admitting: General Surgery

## 2021-06-26 VITALS — BP 110/81 | HR 86 | Temp 98.6°F | Resp 16 | Ht 72.0 in

## 2021-06-26 DIAGNOSIS — Z09 Encounter for follow-up examination after completed treatment for conditions other than malignant neoplasm: Secondary | ICD-10-CM

## 2021-06-27 NOTE — Progress Notes (Signed)
Subjective:     Jordan Ball  Patient presents back with a small reopening of his fistulectomy site.  Patient does have some pain associated with this. Objective:    BP 110/81    Pulse 86    Temp 98.6 F (37 C) (Other (Comment))    Resp 16    Ht 6' (1.829 m)    SpO2 93%    BMI 31.60 kg/m   General:  alert, cooperative, and no distress  Small superficial perianal skin separation at the previous fistulotomy site.  Silver nitrate applied.  No tunneling of the wound.     Assessment:    Superficial wound opening, status post fistulotomy several months ago.    Plan:   Keep wound clean with soap and water.  Follow-up here in 2 weeks for wound check.

## 2021-07-10 ENCOUNTER — Encounter: Payer: BC Managed Care – PPO | Admitting: General Surgery

## 2021-11-06 ENCOUNTER — Emergency Department (HOSPITAL_COMMUNITY)
Admission: EM | Admit: 2021-11-06 | Discharge: 2021-11-06 | Disposition: A | Discharge status: Home / Self Care | Payer: Commercial Managed Care - PPO | Attending: Emergency Medicine | Admitting: Emergency Medicine

## 2021-11-06 ENCOUNTER — Encounter (HOSPITAL_COMMUNITY): Payer: Self-pay | Admitting: Emergency Medicine

## 2021-11-06 ENCOUNTER — Other Ambulatory Visit: Payer: Self-pay

## 2021-11-06 DIAGNOSIS — M545 Low back pain, unspecified: Secondary | ICD-10-CM | POA: Insufficient documentation

## 2021-11-06 MED ORDER — OXYCODONE-ACETAMINOPHEN 5-325 MG PO TABS: 1.0000 | ORAL_TABLET | Freq: Four times a day (QID) | ORAL | 0 refills | Status: DC | PRN

## 2021-11-06 MED ORDER — IBUPROFEN 800 MG PO TABS: 800.0000 mg | ORAL_TABLET | Freq: Three times a day (TID) | ORAL | 0 refills | Status: AC

## 2021-11-06 MED ORDER — OXYCODONE-ACETAMINOPHEN 5-325 MG PO TABS
1.0000 | ORAL_TABLET | Freq: Once | ORAL | Status: AC
  Administered 2021-11-06: 1 via ORAL
  Filled 2021-11-06: qty 1

## 2021-11-06 MED ORDER — CYCLOBENZAPRINE HCL 10 MG PO TABS: 10.0000 mg | ORAL_TABLET | Freq: Three times a day (TID) | ORAL | 0 refills | Status: DC | PRN

## 2021-11-06 NOTE — ED Provider Notes (Signed)
[facility] EMERGENCY DEPARTMENT Provider Note   CSN: [id number] Arrival date & time: 11/06/21  1354     History  Chief Complaint  Patient presents with   Back Pain    Jordan Ball is a 36 y.o. male.  He has no significant past medical history.  He said he was putting a cedar chest into the bed of the truck yesterday when he had acute onset of right-sided low back pain.  It was severe in nature and spasming.  Felt like he was going to go to his knees.  Did not radiate into his legs or his abdomen, no bowel or bladder incontinence.  No numbness or weakness.  Pain trouble him all night long with any type of movement.  No prior history of back issues.  Tried some ibuprofen and Robaxin without improvement.  Does not have a PCP but does have insurance.  No fevers or chills, no IV drug use.  The history is provided by the patient.  Back Pain Location:  Lumbar spine Quality:  Stabbing and shooting Radiates to:  Does not radiate Pain severity:  Severe Pain is:  Same all the time Onset quality:  Sudden Duration:  2 days Timing:  Constant Progression:  Unchanged Chronicity:  New Context: lifting heavy objects   Relieved by:  Nothing Worsened by:  Ambulation and bending Ineffective treatments:  NSAIDs and muscle relaxants Associated symptoms: no abdominal pain, no bladder incontinence, no bowel incontinence, no chest pain, no dysuria, no fever, no leg pain, no tingling and no weakness   Risk factors: no hx of cancer, no steroid use and no vascular disease       Home Medications Prior to Admission medications   Medication Sig Start Date End Date Taking? Authorizing Provider  omeprazole (PRILOSEC) 20 MG capsule Take 1 capsule (20 mg total) by mouth daily for 10 days. 03/13/21 04/26/21  Linwood Dibbles, MD      Allergies    Patient has no known allergies.    Review of Systems   Review of Systems  Constitutional:  Negative for fever.  HENT:  Negative for sore throat.   Respiratory:   Negative for shortness of breath.   Cardiovascular:  Negative for chest pain.  Gastrointestinal:  Negative for abdominal pain and bowel incontinence.  Genitourinary:  Negative for bladder incontinence and dysuria.  Musculoskeletal:  Positive for back pain.  Skin:  Negative for rash.  Neurological:  Negative for tingling and weakness.   Physical Exam Updated Vital Signs BP 102/80   Pulse (!) 106   Temp 98.1 F (36.7 C) (Oral)   Resp 18   Ht 6' (1.829 m)   Wt 105.7 kg   SpO2 96%   BMI 31.60 kg/m  Physical Exam Vitals and nursing note reviewed.  Constitutional:      General: He is not in acute distress.    Appearance: Normal appearance. He is well-developed.  HENT:     Head: Normocephalic and atraumatic.  Eyes:     Conjunctiva/sclera: Conjunctivae normal.  Cardiovascular:     Rate and Rhythm: Normal rate and regular rhythm.     Heart sounds: No murmur heard. Pulmonary:     Effort: Pulmonary effort is normal. No respiratory distress.     Breath sounds: Normal breath sounds.  Abdominal:     Palpations: Abdomen is soft.     Tenderness: There is no abdominal tenderness.  Musculoskeletal:        General: Tenderness present. No swelling.  Cervical back: Neck supple.     Comments: There is no midline cervical thoracic or lumbar tenderness.  He does have right paralumbar tenderness and spasm.  Skin:    General: Skin is warm and dry.     Capillary Refill: Capillary refill takes less than 2 seconds.  Neurological:     General: No focal deficit present.     Mental Status: He is alert.     Sensory: No sensory deficit.     Motor: No weakness.     Gait: Gait normal.     Comments: He is slow to get from sitting to standing and has a slow gait but otherwise is able to safely ambulate  Psychiatric:        Mood and Affect: Mood normal.    ED Results / Procedures / Treatments   Labs (all labs ordered are listed, but only abnormal results are displayed) Labs Reviewed - No data  to display  EKG None  Radiology No results found.  Procedures Procedures    Medications Ordered in ED Medications  oxyCODONE-acetaminophen (PERCOCET/ROXICET) 5-325 MG per tablet 1 tablet (has no administration in time range)    ED Course/ Medical Decision Making/ A&P                           Medical Decision Making Risk Prescription drug management.  Patient is here with what sounds like mechanical back pain after lifting.  No red flags no indications for imaging at this time.  We will treat with NSAIDs muscle relaxant and short course of pain medication.  Recommended establishing care with primary care doctor.  No radicular symptoms concerning for disc herniation.  No signs and symptoms of cauda equina.  No fevers no IV drug use so doubt epidural abscess.  Return instructions discussed         Final Clinical Impression(s) / ED Diagnoses Final diagnoses:  Acute right-sided low back pain without sciatica    Rx / DC Orders ED Discharge Orders          Ordered    ibuprofen (ADVIL) 800 MG tablet  3 times daily        11/06/21 1438    cyclobenzaprine (FLEXERIL) 10 MG tablet  3 times daily PRN        11/06/21 1438    oxyCODONE-acetaminophen (PERCOCET/ROXICET) 5-325 MG tablet  Every 6 hours PRN        11/06/21 1438              Terrilee Files, MD 11/06/21 1727

## 2021-11-06 NOTE — ED Triage Notes (Signed)
Pt injured lower back after moving some furniture, has taken muscle relaxers and Motrin w/o any relief.

## 2021-11-06 NOTE — Discharge Instructions (Addendum)
You were seen in the emergency department for acute right-sided low back pain after lifting.  This is likely muscular pain and spasm.  You can try ice or heat to the area.  We are prescribing you some anti-inflammatory pain medication and muscle relaxant.  Gentle stretching.  Follow-up with your primary care doctor.  Return to the emergency department if any high fevers or weakness, numbness.  Do not drink alcohol or drive if using the pain medication or muscle relaxants.

## 2022-01-11 IMAGING — CT CT NECK W/ CM
3 of 8 series · 9 of 33 positions shown, 11 images · IV contrast (Omnipaque or Isovue)
Comparison: No pertinent prior studies available for comparison.

CLINICAL DATA: Throat pain, swelling in right side of jaw,
difficult to speak and swallow, vomiting for 4 days.

EXAM:
CT NECK WITH CONTRAST
TECHNIQUE: Multidetector CT imaging of the neck was performed using the
standard protocol following the bolus administration of intravenous
contrast.
CONTRAST:  75mL OMNIPAQUE IOHEXOL 300 MG/ML  SOLN

[Series 2: axial neck · axial · 0.58mm/px · z∈[-156,-36]mm · 3 of 120 slices shown]
[im 30/120  bone]
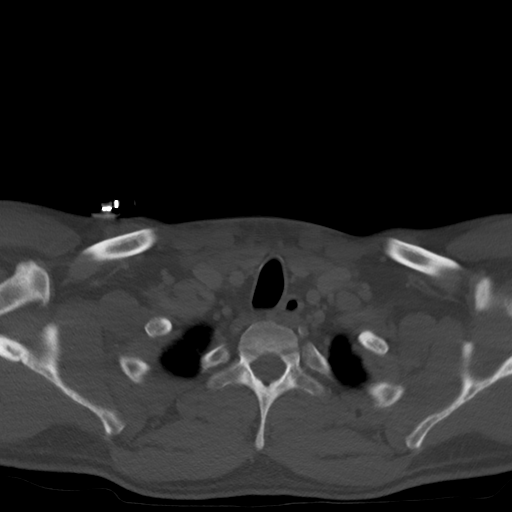
[im 60/120  bone]
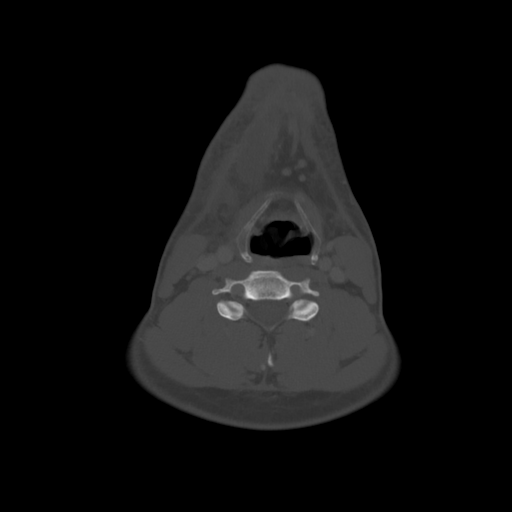
[im 90/120  bone]
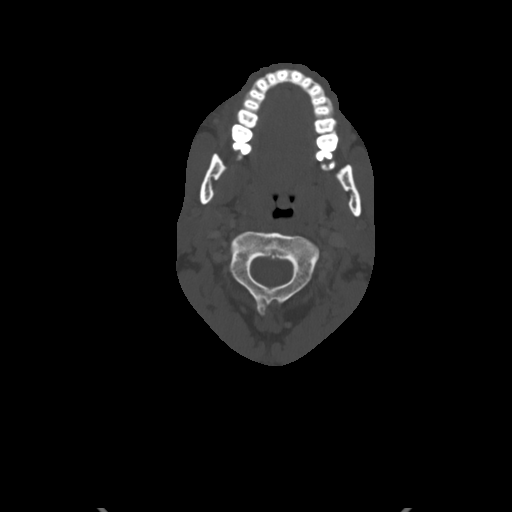

[Series 4: cor neck · coronal · 0.43mm/px · 1 of 118 slices shown]
[im 59/118  bone]
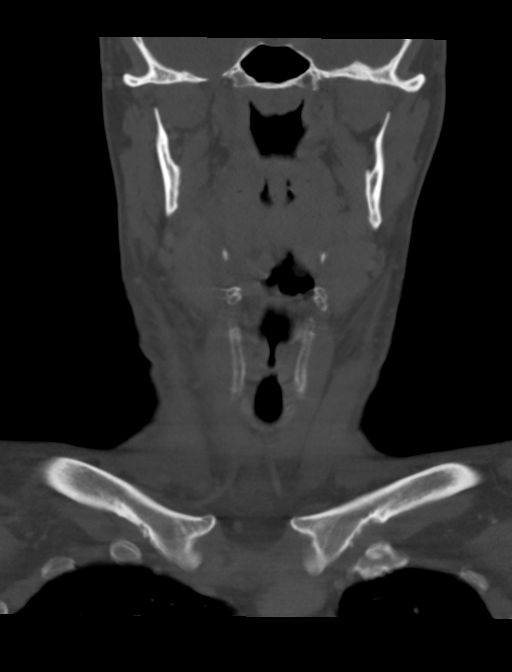

[Series 6: ax oropharynx · axial · 0.39mm/px · z∈[-230,-40]mm · 5 of 153 slices shown, 7 images]
[im 26/153  soft-tissue]
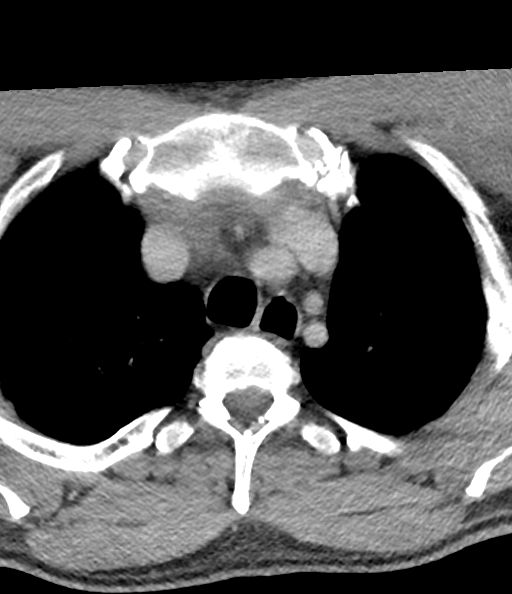
[im 26/153  bone]
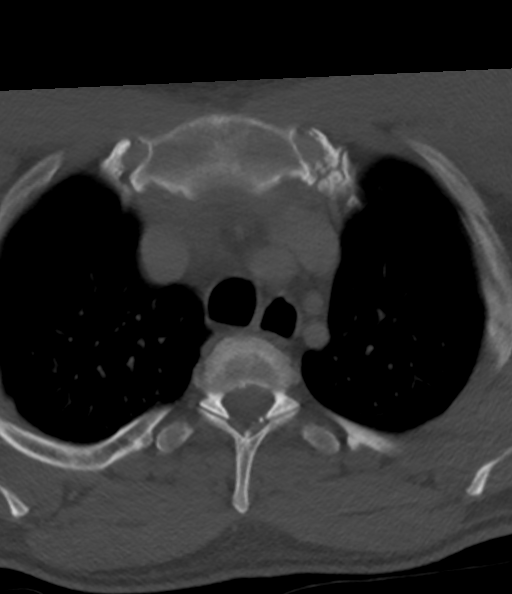
[im 51/153  bone]
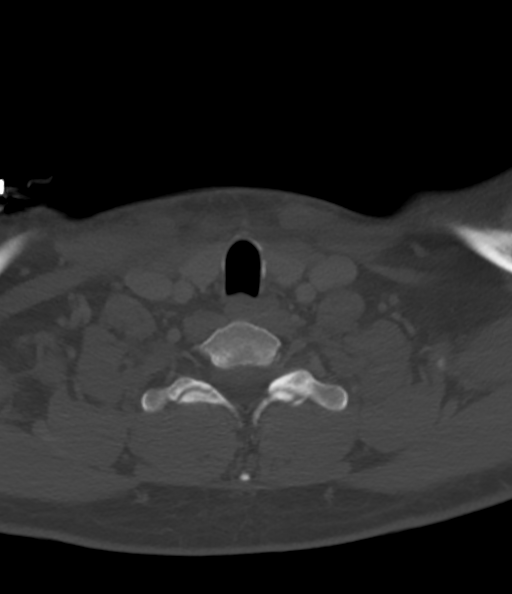
[im 77/153  bone]
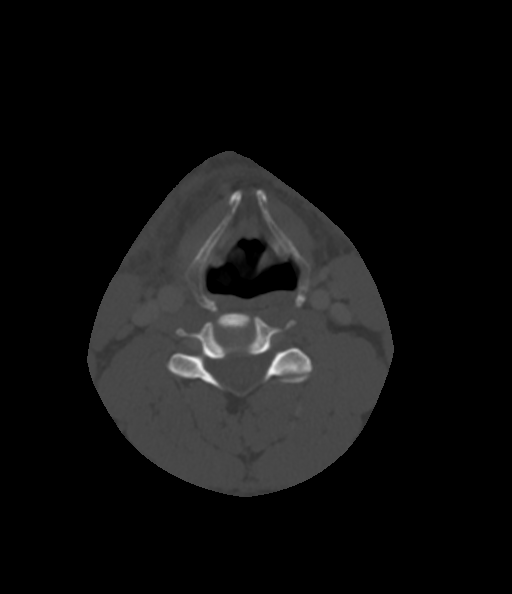
[im 102/153  bone]
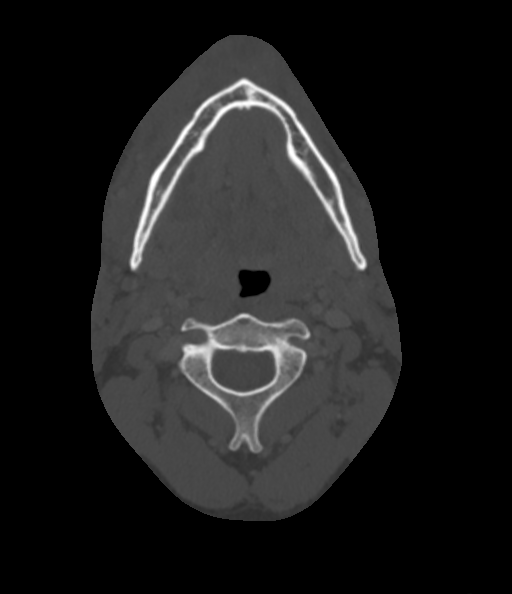
[im 127/153  soft-tissue]
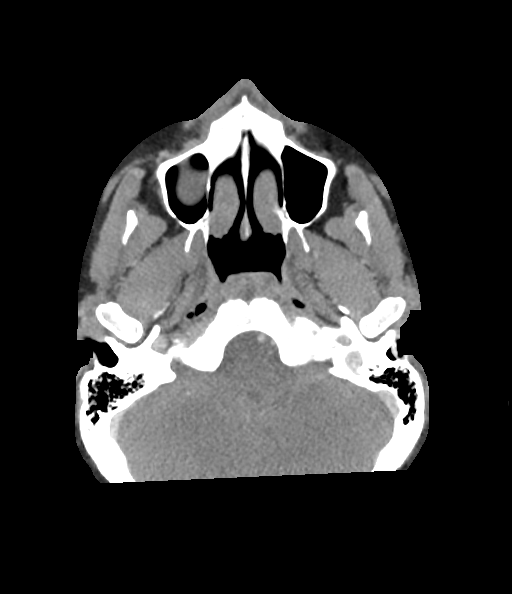
[im 127/153  bone]
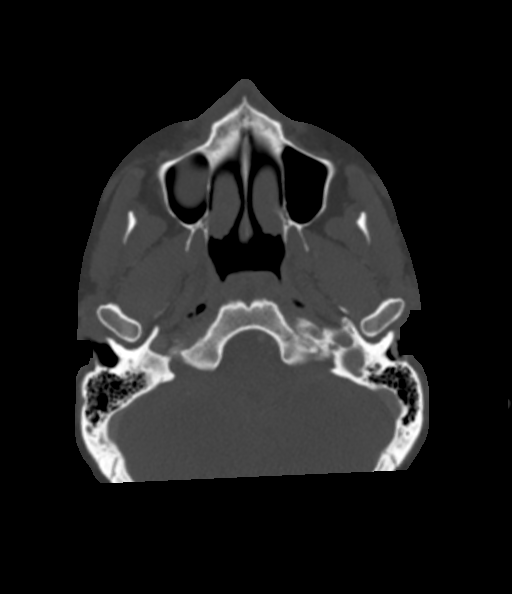

[9 of 33 positions shown; findings below may reference images not displayed]

FINDINGS: Motion degraded examination.

Pharynx and larynx:

There is prominent soft tissue swelling centered along the posterior
right mandible at site of a carious posterior right mandibular
molar. There is subtle periapical lucency surrounding this carious
posterior right mandibular molar. here is circumscribed hypodensity
centered at this site along both the superficial and deep aspects of
the mandible measuring 2.7 x 2.1 x 2.1 cm likely reflecting soft
tissue abscess (series 2, image 44) (series 4, image 47). There is
additional surrounding perimandibular phlegmon. Edema tracks into
the right floor of mouth were there is associated myositis (series
4, image 37). Edema also extends more inferiorly within the neck.
Edema also extends to the submucosal right pharyngeal and
parapharyngeal regions. The oropharyngeal airway remains patent. The
larynx is unremarkable.

Salivary glands: Likely reactive edema and swelling of the right
submandibular gland. The bilateral parotid and left submandibular
glands are unremarkable.

Thyroid: No abnormality

Lymph nodes: Upper cervical chain lymphadenopathy (greater on the
right), likely reactive.

Vascular: The major vascular structures of the neck appear patent.

Limited intracranial: No abnormality identified.

Visualized orbits: Visualized orbits demonstrate no acute
abnormality.

Mastoids and visualized paranasal sinuses: Moderate-sized right
maxillary sinus mucous retention cyst. No significant mastoid
effusion.

Skeleton: No acute bony abnormality.

Upper chest: No consolidation within the imaged lung apices.

These results were called by telephone at the time of interpretation
on 07/31/2019 at [DATE] to provider PA NALLDO XAVIER DE OLIVEIRA , who verbally
acknowledged these results.
IMPRESSION: Perimandibular, floor of mouth and upper neck soft tissue infection
which appears centered along the right posterior mandible where
there is a carious posterior right mandibular molar with periapical
lucency. Circumscribed hypodensity along the superficial and deep
aspects of the mandible at this site measuring 2.7 x 2.1 x 2.1 cm
likely reflecting soft tissue abscess. Edema not only tracks into
the floor of mouth but also into the submucosal right pharyngeal and
parapharyngeal regions.

The oropharyngeal airway remains patent.

Upper cervical lymphadenopathy, likely reactive.

## 2022-08-02 DIAGNOSIS — F331 Major depressive disorder, recurrent, moderate: Secondary | ICD-10-CM | POA: Diagnosis not present

## 2022-08-02 DIAGNOSIS — E6609 Other obesity due to excess calories: Secondary | ICD-10-CM | POA: Diagnosis not present

## 2022-08-02 DIAGNOSIS — Z6831 Body mass index (BMI) 31.0-31.9, adult: Secondary | ICD-10-CM | POA: Diagnosis not present

## 2022-09-24 DIAGNOSIS — K529 Noninfective gastroenteritis and colitis, unspecified: Secondary | ICD-10-CM | POA: Diagnosis not present

## 2022-09-24 DIAGNOSIS — E860 Dehydration: Secondary | ICD-10-CM | POA: Diagnosis not present

## 2022-11-04 ENCOUNTER — Other Ambulatory Visit: Payer: Self-pay

## 2022-11-04 ENCOUNTER — Encounter (HOSPITAL_COMMUNITY): Payer: Self-pay | Admitting: Emergency Medicine

## 2022-11-04 ENCOUNTER — Emergency Department (HOSPITAL_COMMUNITY)
Admission: EM | Admit: 2022-11-04 | Discharge: 2022-11-04 | Disposition: A | Discharge status: Home / Self Care | Payer: Self-pay | Attending: Emergency Medicine | Admitting: Emergency Medicine

## 2022-11-04 DIAGNOSIS — L509 Urticaria, unspecified: Secondary | ICD-10-CM | POA: Insufficient documentation

## 2022-11-04 LAB — COMPREHENSIVE METABOLIC PANEL
ALT: 31 U/L (ref 0–44)
AST: 25 U/L (ref 15–41)
Albumin: 4.1 g/dL (ref 3.5–5.0)
Alkaline Phosphatase: 62 U/L (ref 38–126)
Anion gap: 11 (ref 5–15)
BUN: 13 mg/dL (ref 6–20)
CO2: 19 mmol/L — ABNORMAL LOW (ref 22–32)
Calcium: 9.1 mg/dL (ref 8.9–10.3)
Chloride: 107 mmol/L (ref 98–111)
Creatinine, Ser: 1.14 mg/dL (ref 0.61–1.24)
GFR, Estimated: >60 mL/min (ref >60)
Glucose, Bld: 131 mg/dL — ABNORMAL HIGH (ref 70–99)
Potassium: 3.4 mmol/L — ABNORMAL LOW (ref 3.5–5.1)
Sodium: 137 mmol/L (ref 135–145)
Total Bilirubin: 0.7 mg/dL (ref 0.3–1.2)
Total Protein: 7.2 g/dL (ref 6.5–8.1)

## 2022-11-04 LAB — CBC WITH DIFFERENTIAL/PLATELET
Abs Immature Granulocytes: 0.07 10*3/uL (ref 0.00–0.07)
Basophils Absolute: 0.1 10*3/uL (ref 0.0–0.1)
Basophils Relative: 1 %
Eosinophils Absolute: 0.2 10*3/uL (ref 0.0–0.5)
Eosinophils Relative: 2 %
HCT: 49.9 % (ref 39.0–52.0)
Hemoglobin: 17.4 g/dL — ABNORMAL HIGH (ref 13.0–17.0)
Immature Granulocytes: 1 %
Lymphocytes Relative: 39 %
Lymphs Abs: 4.1 10*3/uL — ABNORMAL HIGH (ref 0.7–4.0)
MCH: 32 pg (ref 26.0–34.0)
MCHC: 34.9 g/dL (ref 30.0–36.0)
MCV: 91.7 fL (ref 80.0–100.0)
Monocytes Absolute: 0.9 10*3/uL (ref 0.1–1.0)
Monocytes Relative: 9 %
Neutro Abs: 5.1 10*3/uL (ref 1.7–7.7)
Neutrophils Relative %: 48 %
Platelets: 368 10*3/uL (ref 150–400)
RBC: 5.44 MIL/uL (ref 4.22–5.81)
RDW: 12.1 % (ref 11.5–15.5)
WBC: 10.5 10*3/uL (ref 4.0–10.5)
nRBC: 0 % (ref 0.0–0.2)

## 2022-11-04 MED ORDER — DIPHENHYDRAMINE HCL 50 MG/ML IJ SOLN
25.0000 mg | Freq: Once | INTRAMUSCULAR | Status: AC
  Administered 2022-11-04: 25 mg via INTRAVENOUS
  Filled 2022-11-04: qty 1

## 2022-11-04 MED ORDER — METHYLPREDNISOLONE SODIUM SUCC 125 MG IJ SOLR
125.0000 mg | Freq: Once | INTRAMUSCULAR | Status: AC
  Administered 2022-11-04: 125 mg via INTRAVENOUS
  Filled 2022-11-04: qty 2

## 2022-11-04 MED ORDER — FAMOTIDINE IN NACL 20-0.9 MG/50ML-% IV SOLN
20.0000 mg | Freq: Once | INTRAVENOUS | Status: AC
  Administered 2022-11-04: 20 mg via INTRAVENOUS
  Filled 2022-11-04: qty 50

## 2022-11-04 MED ORDER — PREDNISONE 10 MG PO TABS: 20.0000 mg | ORAL_TABLET | Freq: Two times a day (BID) | ORAL | 0 refills | Status: DC

## 2022-11-04 NOTE — ED Provider Notes (Signed)
[place] EMERGENCY DEPARTMENT AT [facility] Provider Note   CSN: [id number] Arrival date & time: 11/04/22  0047     History  Chief Complaint  Patient presents with   Allergic Reaction    Jordan Ball is a 37 y.o. male.  Patient is a 37 year old male with no significant past medical history.  Patient presenting today with complaints of hives.  He was laying down to go to bed this evening when he began to itch in his forearms.  He looked down and noticed he had swelling and redness extending up his arms and into his axilla.  He then began to notice a strange sensation in his throat, but denies any difficulty swallowing or breathing.  Patient denies any new contacts or exposures.  He has not taken any medications prior to coming here.  The history is provided by the patient.       Home Medications Prior to Admission medications   Medication Sig Start Date End Date Taking? Authorizing Provider  cyclobenzaprine (FLEXERIL) 10 MG tablet Take 1 tablet (10 mg total) by mouth 3 (three) times daily as needed for muscle spasms. 11/06/21   Terrilee Files, MD  ibuprofen (ADVIL) 800 MG tablet Take 1 tablet (800 mg total) by mouth 3 (three) times daily. 11/06/21   Terrilee Files, MD  omeprazole (PRILOSEC) 20 MG capsule Take 1 capsule (20 mg total) by mouth daily for 10 days. 03/13/21 04/26/21  Linwood Dibbles, MD  oxyCODONE-acetaminophen (PERCOCET/ROXICET) 5-325 MG tablet Take 1 tablet by mouth every 6 (six) hours as needed for severe pain. 11/06/21   Terrilee Files, MD      Allergies    Patient has no known allergies.    Review of Systems   Review of Systems  All other systems reviewed and are negative.   Physical Exam Updated Vital Signs BP (!) 143/96 (BP Location: Right Arm)   Pulse (!) 113   Temp 97.6 F (36.4 C) (Oral)   Resp 18   Ht 6' (1.829 m)   Wt 105.2 kg   SpO2 94%   BMI 31.46 kg/m  Physical Exam Vitals and nursing note reviewed.  Constitutional:       General: He is not in acute distress.    Appearance: He is well-developed. He is not diaphoretic.  HENT:     Head: Normocephalic and atraumatic.  Cardiovascular:     Rate and Rhythm: Normal rate and regular rhythm.     Heart sounds: No murmur heard.    No friction rub.  Pulmonary:     Effort: Pulmonary effort is normal. No respiratory distress.     Breath sounds: Normal breath sounds. No stridor. No wheezing or rales.  Abdominal:     General: Bowel sounds are normal. There is no distension.     Palpations: Abdomen is soft.     Tenderness: There is no abdominal tenderness.  Musculoskeletal:        General: Normal range of motion.     Cervical back: Normal range of motion and neck supple.  Skin:    General: Skin is warm and dry.     Comments: There is an urticarial rash noted extending from the bilateral wrists and into the bilateral axillas.  Neurological:     Mental Status: He is alert and oriented to person, place, and time.     Coordination: Coordination normal.     ED Results / Procedures / Treatments   Labs (all labs ordered  are listed, but only abnormal results are displayed) Labs Reviewed  CBC WITH DIFFERENTIAL/PLATELET - Abnormal; Notable for the following components:      Result Value   Hemoglobin 17.4 (*)    Lymphs Abs 4.1 (*)    All other components within normal limits  COMPREHENSIVE METABOLIC PANEL    EKG None  Radiology No results found.  Procedures Procedures    Medications Ordered in ED Medications  methylPREDNISolone sodium succinate (SOLU-MEDROL) 125 mg/2 mL injection 125 mg (has no administration in time range)  diphenhydrAMINE (BENADRYL) injection 25 mg (has no administration in time range)  famotidine (PEPCID) IVPB 20 mg premix (has no administration in time range)    ED Course/ Medical Decision Making/ A&P  Patient is a 37 year old male presenting with itching and rash to the bilateral upper extremities.  He also describes some  scratchiness in his throat.  This started acutely this evening in the absence of any known allergen/trigger.  Patient arrives here with stable vital signs and is afebrile.  There is no hypoxia.  CBC and basic metabolic panel within normal limits.  Patient given IV Solu-Medrol, IV Pepcid, and IV Benadryl and has been observed for several hours.  He continues to have some swelling of the upper extremities, but the redness and hives have significantly improved.  At this point, I feel as though he can safely be discharged.  I will prescribe prednisone and have him take Benadryl for the next several days.  To return as needed if symptoms worsen or change.  Because of this reaction unknown as patient cannot recall any new contacts or exposures.  Final Clinical Impression(s) / ED Diagnoses Final diagnoses:  None    Rx / DC Orders ED Discharge Orders     None         Geoffery Lyons, MD 11/04/22 [phone]

## 2022-11-04 NOTE — ED Triage Notes (Signed)
Pt here with c/o allergic reaction. States he started itching about an hour ago. Pt presents with hives and redness to both forearms and torso. States his swallowing "feels funny".

## 2022-11-04 NOTE — Discharge Instructions (Signed)
Begin taking prednisone as prescribed.  Begin taking Benadryl 25 mg every 6 hours for the next 3 days.  This medication is available over-the-counter.  Return to the emergency department if you develop difficulty breathing, difficulty swallowing, severe weakness, or for other new and concerning symptoms.

## 2023-08-25 IMAGING — CT CT ABD-PELV W/ CM
2 of 4 series · 16 of 46 positions shown, 18 images · IV contrast (omnipaque)
Comparison: None.

CLINICAL DATA: Abdomen pain with diarrhea

EXAM:
CT ABDOMEN AND PELVIS WITH CONTRAST
TECHNIQUE: Multidetector CT imaging of the abdomen and pelvis was performed
using the standard protocol following bolus administration of
intravenous contrast.
CONTRAST:  80mL OMNIPAQUE IOHEXOL 350 MG/ML SOLN

[Series 2: axial st · axial · 0.92mm/px · z∈[+703,+1178]mm · 13 of 105 slices shown, 15 images]
[im 5/105  soft-tissue]
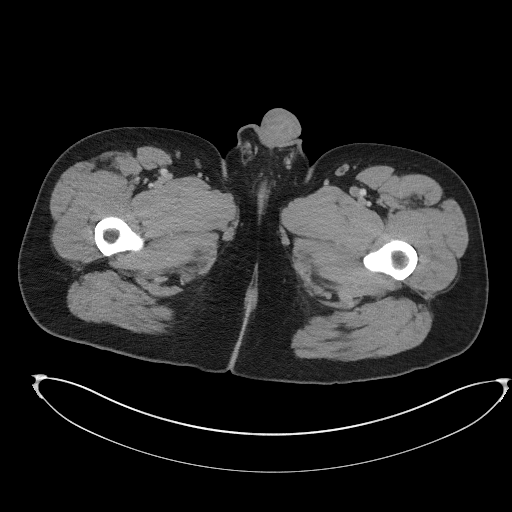
[im 5/105  bone]
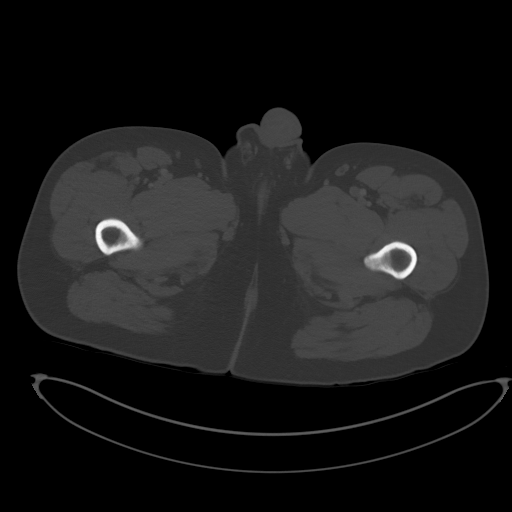
[im 14/105  soft-tissue]
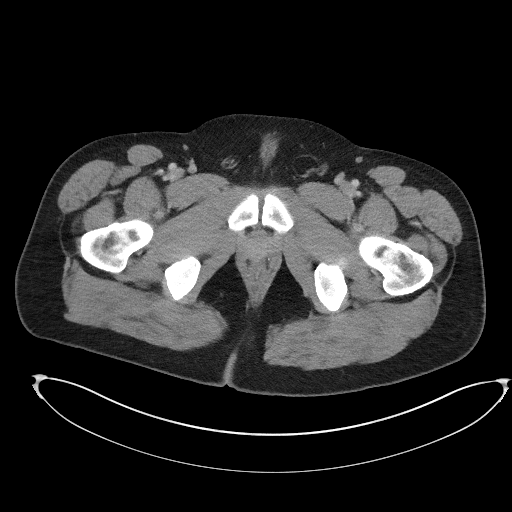
[im 23/105  soft-tissue]
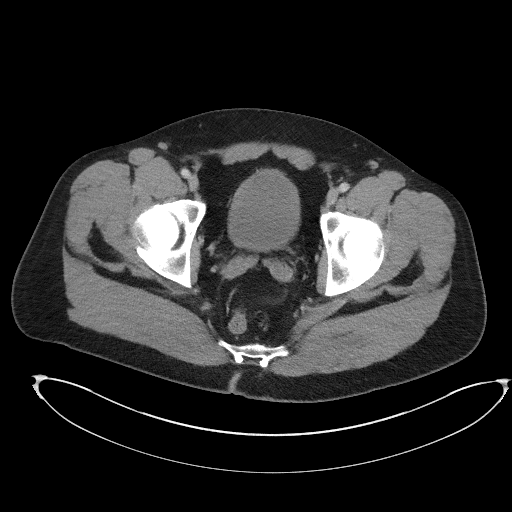
[im 28/105  soft-tissue]
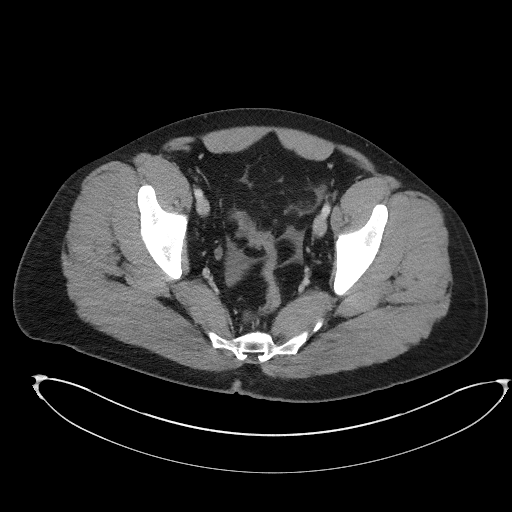
[im 37/105  soft-tissue]
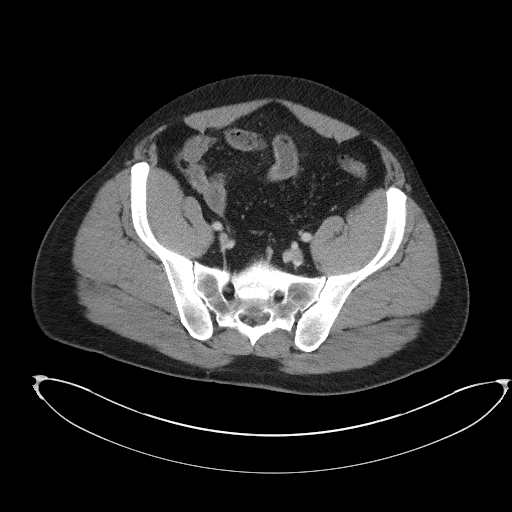
[im 46/105  soft-tissue]
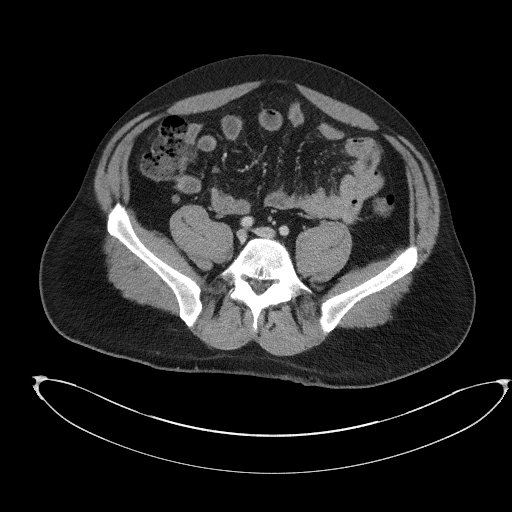
[im 55/105  soft-tissue]
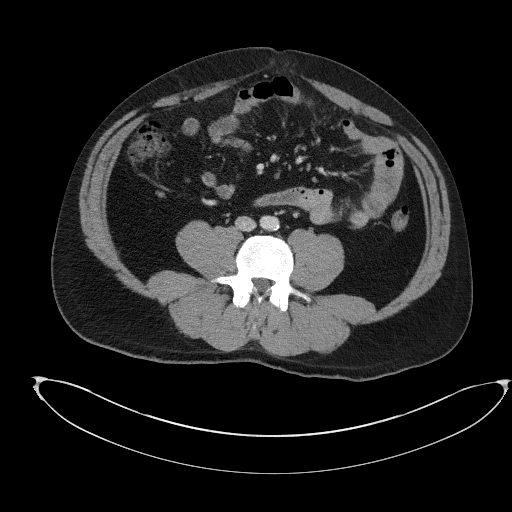
[im 59/105  soft-tissue]
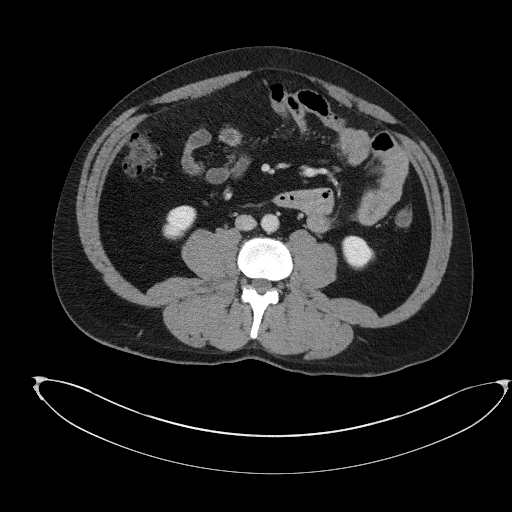
[im 68/105  soft-tissue]
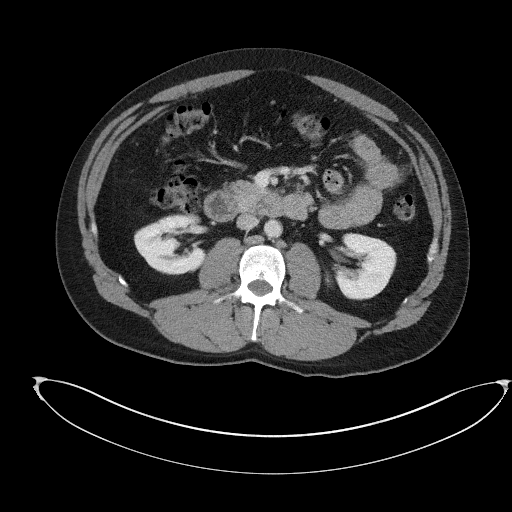
[im 68/105  bone]
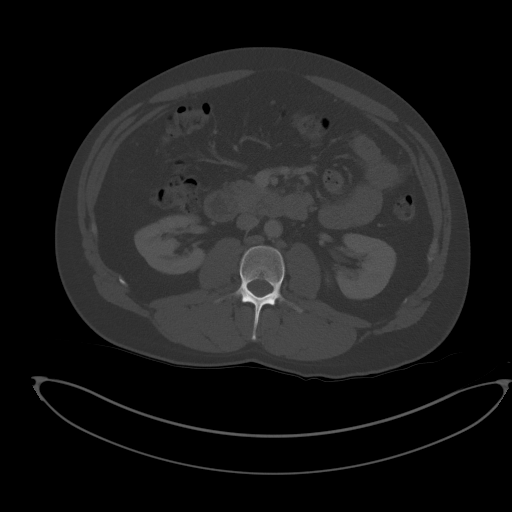
[im 77/105  soft-tissue]
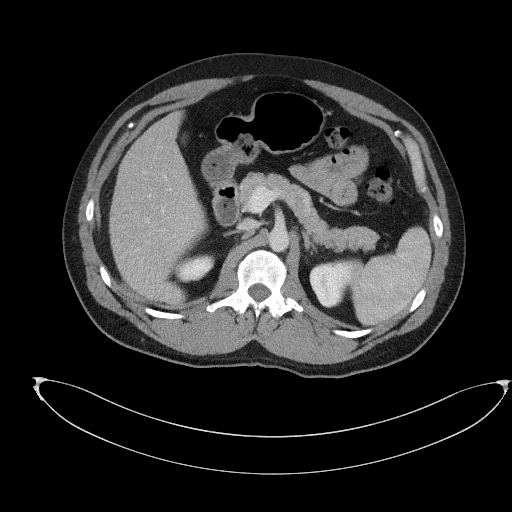
[im 82/105  soft-tissue]
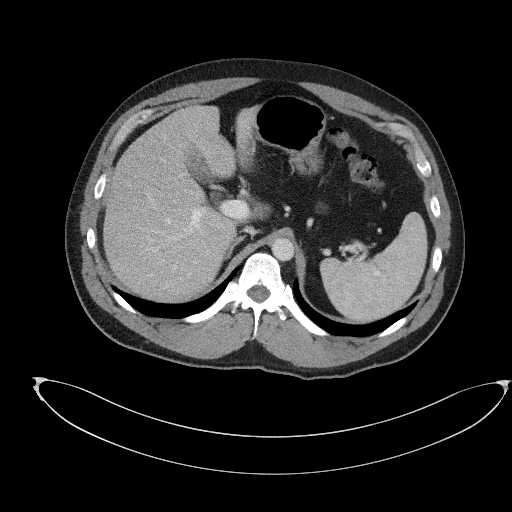
[im 91/105  soft-tissue]
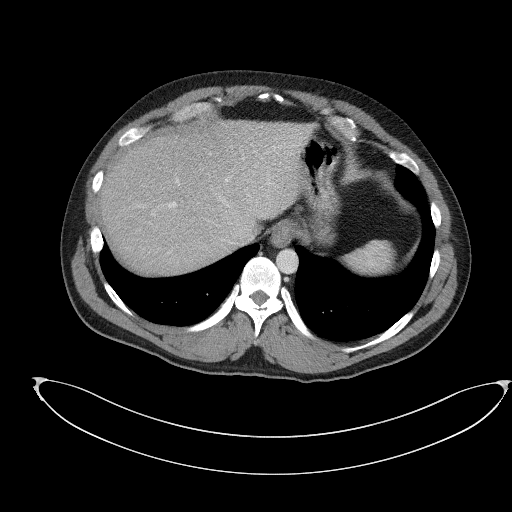
[im 100/105  soft-tissue]
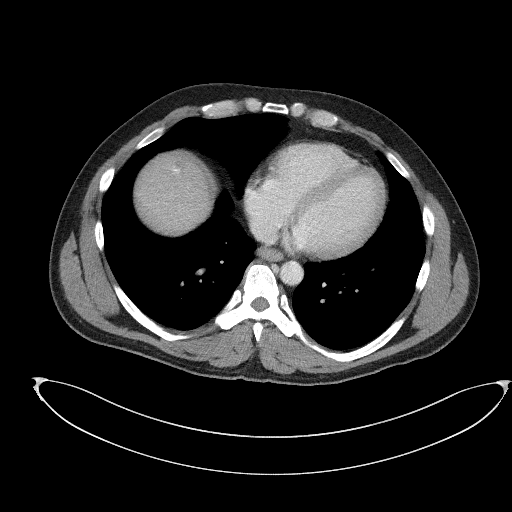

[Series 5: coronal st · coronal · 0.91mm/px · 3 of 117 slices shown]
[im 39/117  soft-tissue]
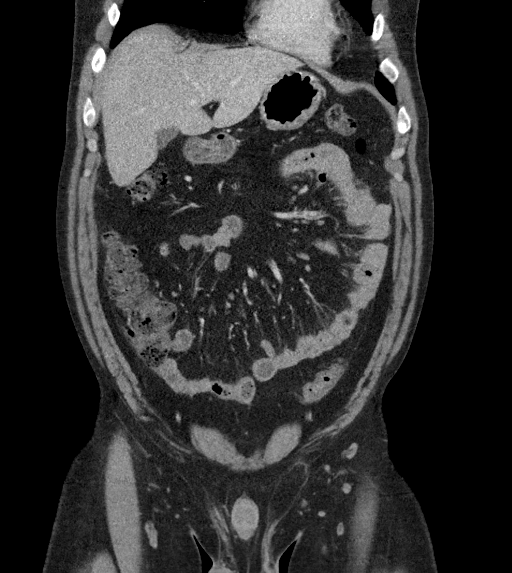
[im 52/117  soft-tissue]
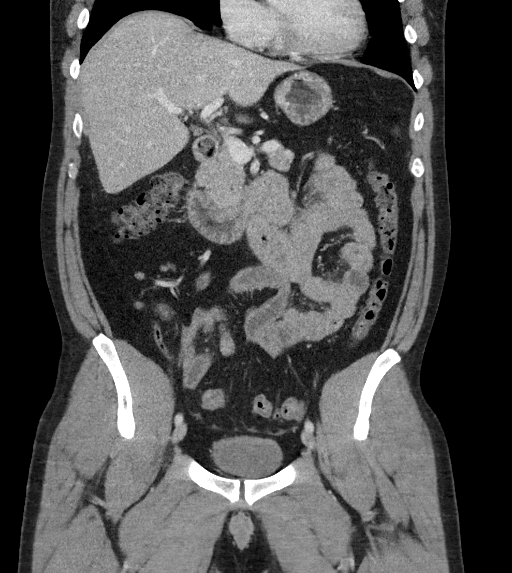
[im 65/117  soft-tissue]
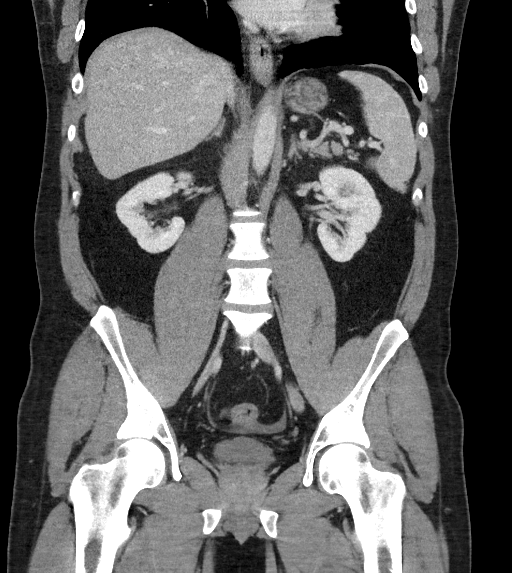

[16 of 46 positions shown; findings below may reference images not displayed]

FINDINGS: Lower chest: Lung bases demonstrate no acute consolidation or
effusion. Normal cardiac size. 11 mm hypodense lesion with
peripheral enhancement at the anterior dome of liver, series 2,
image 6 likely represent small hemangioma. No calcified gallstone or
biliary dilatation

Hepatobiliary: No focal liver abnormality is seen. No gallstones,
gallbladder wall thickening, or biliary dilatation.

Pancreas: Unremarkable. No pancreatic ductal dilatation or
surrounding inflammatory changes.

Spleen: Normal in size without focal abnormality.

Adrenals/Urinary Tract: Adrenal glands are unremarkable. Kidneys are
normal, without renal calculi, focal lesion, or hydronephrosis.
Bladder is unremarkable.

Stomach/Bowel: Stomach nonenlarged. Diverticular disease of the
colon without acute wall thickening. Negative appendix. Possible
mild thickening of pelvic small bowel loops with mild mucosal
enhancement, for example series 2, image 66 and series 2, image 71.
Bowel does not appear dilated.

Vascular/Lymphatic: No significant vascular findings are present. No
enlarged abdominal or pelvic lymph nodes.

Reproductive: Prostate is unremarkable.

Other: No free air. Small free fluid in the pelvis. Small fat
containing left inguinal hernia

Musculoskeletal: No acute or significant osseous findings.
IMPRESSION: 1. Small free fluid in the pelvis of uncertain source, no free air
is seen. Suspicion of slightly thickened pelvic small bowel loops
with mild mucosal enhancement as may be seen with infectious
enteritis or inflammatory bowel disease.
2. Diverticular disease of the colon without acute wall thickening

## 2024-01-25 ENCOUNTER — Emergency Department (HOSPITAL_COMMUNITY)
Admission: EM | Admit: 2024-01-25 | Discharge: 2024-01-25 | Disposition: A | Discharge status: Home / Self Care | Payer: Self-pay | Attending: Emergency Medicine | Admitting: Emergency Medicine

## 2024-01-25 ENCOUNTER — Encounter (HOSPITAL_COMMUNITY): Payer: Self-pay

## 2024-01-25 ENCOUNTER — Other Ambulatory Visit: Payer: Self-pay

## 2024-01-25 ENCOUNTER — Emergency Department (HOSPITAL_COMMUNITY): Payer: Self-pay

## 2024-01-25 DIAGNOSIS — E878 Other disorders of electrolyte and fluid balance, not elsewhere classified: Secondary | ICD-10-CM | POA: Insufficient documentation

## 2024-01-25 DIAGNOSIS — D72829 Elevated white blood cell count, unspecified: Secondary | ICD-10-CM | POA: Insufficient documentation

## 2024-01-25 DIAGNOSIS — T782XXA Anaphylactic shock, unspecified, initial encounter: Secondary | ICD-10-CM | POA: Insufficient documentation

## 2024-01-25 DIAGNOSIS — D699 Hemorrhagic condition, unspecified: Secondary | ICD-10-CM | POA: Insufficient documentation

## 2024-01-25 DIAGNOSIS — N179 Acute kidney failure, unspecified: Secondary | ICD-10-CM | POA: Insufficient documentation

## 2024-01-25 LAB — CBC WITH DIFFERENTIAL/PLATELET
Abs Granulocyte: 18.3 K/uL — ABNORMAL HIGH (ref 1.5–6.5)
Abs Immature Granulocytes: 0.18 K/uL — ABNORMAL HIGH (ref 0.00–0.07)
Basophils Absolute: 0.2 K/uL — ABNORMAL HIGH (ref 0.0–0.1)
Basophils Relative: 1 %
Eosinophils Absolute: 0.2 K/uL (ref 0.0–0.5)
Eosinophils Relative: 1 %
HCT: 55.7 % — ABNORMAL HIGH (ref 39.0–52.0)
Hemoglobin: 19.9 g/dL — ABNORMAL HIGH (ref 13.0–17.0)
Immature Granulocytes: 1 %
Lymphocytes Relative: 12 %
Lymphs Abs: 2.6 K/uL (ref 0.7–4.0)
MCH: 32.8 pg (ref 26.0–34.0)
MCHC: 35.7 g/dL (ref 30.0–36.0)
MCV: 91.9 fL (ref 80.0–100.0)
Monocytes Absolute: 0.9 K/uL (ref 0.1–1.0)
Monocytes Relative: 4 %
Neutro Abs: 18.3 K/uL — ABNORMAL HIGH (ref 1.7–7.7)
Neutrophils Relative %: 81 %
Platelets: 474 K/uL — ABNORMAL HIGH (ref 150–400)
RBC: 6.06 MIL/uL — ABNORMAL HIGH (ref 4.22–5.81)
RDW: 12.4 % (ref 11.5–15.5)
WBC: 22.4 K/uL — ABNORMAL HIGH (ref 4.0–10.5)
nRBC: 0 % (ref 0.0–0.2)

## 2024-01-25 LAB — BASIC METABOLIC PANEL WITH GFR
Anion gap: 20 — ABNORMAL HIGH (ref 5–15)
BUN: 25 mg/dL — ABNORMAL HIGH (ref 6–20)
CO2: 19 mmol/L — ABNORMAL LOW (ref 22–32)
Calcium: 10 mg/dL (ref 8.9–10.3)
Chloride: 98 mmol/L (ref 98–111)
Creatinine, Ser: 2.02 mg/dL — ABNORMAL HIGH (ref 0.61–1.24)
GFR, Estimated: 43 mL/min — ABNORMAL LOW (ref >60)
Glucose, Bld: 248 mg/dL — ABNORMAL HIGH (ref 70–99)
Potassium: 3.7 mmol/L (ref 3.5–5.1)
Sodium: 137 mmol/L (ref 135–145)

## 2024-01-25 MED ORDER — EPINEPHRINE 0.3 MG/0.3ML IJ SOAJ: 0.3000 mg | INTRAMUSCULAR | 0 refills | Status: AC | PRN

## 2024-01-25 MED ORDER — METHYLPREDNISOLONE SODIUM SUCC 125 MG IJ SOLR
125.0000 mg | Freq: Once | INTRAMUSCULAR | Status: AC
  Administered 2024-01-25: 125 mg via INTRAVENOUS
  Filled 2024-01-25: qty 2

## 2024-01-25 MED ORDER — SODIUM CHLORIDE 0.9 % IV SOLN: INTRAVENOUS | Status: DC

## 2024-01-25 MED ORDER — PREDNISONE 20 MG PO TABS: 40.0000 mg | ORAL_TABLET | Freq: Every day | ORAL | 0 refills | Status: AC

## 2024-01-25 MED ORDER — FAMOTIDINE 20 MG PO TABS: 20.0000 mg | ORAL_TABLET | Freq: Two times a day (BID) | ORAL | 0 refills | Status: AC

## 2024-01-25 MED ORDER — FAMOTIDINE IN NACL 20-0.9 MG/50ML-% IV SOLN
20.0000 mg | Freq: Once | INTRAVENOUS | Status: AC
  Administered 2024-01-25: 20 mg via INTRAVENOUS
  Filled 2024-01-25: qty 50

## 2024-01-25 MED ORDER — SODIUM CHLORIDE 0.9 % IV BOLUS
1000.0000 mL | Freq: Once | INTRAVENOUS | Status: AC
  Administered 2024-01-25: 1000 mL via INTRAVENOUS
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
## 2024-01-25 NOTE — ED Notes (Signed)
 Pt notified me that he had thrown up. Pt stated he was no longer experiencing any nausea or vomitus feeling.

## 2024-01-25 NOTE — ED Notes (Signed)
 PO challenge at this time. Pt was able to drink water with little to no issues

## 2024-01-25 NOTE — Discharge Instructions (Signed)
 Please continue Benadryl  1 or 2 tablets every 6 hours as needed for allergic reaction.  Finish famotidine  and prednisone  prescriptions.  EpiPen  injection if having significant reaction, please call 911 and return to the emergency department if use.  Follow-up with your regular doctor.  Return to the emergency department if any worsening or concerning symptoms

## 2024-01-25 NOTE — ED Triage Notes (Signed)
 At 1330, itching began with weakness, red color change, and N/A. Tachycardic and hypotensive with REMS. Epi given by EMS to L thigh. 50 mg benadryl  taken before EMS arrival.
